# Patient Record
Sex: Male | Born: 1965 | Race: White | Hispanic: No | Marital: Married | Smoking: Former smoker
Health system: Southern US, Community
[De-identification: ages and names within clinical notes are randomized; demographics above are authoritative.]

## PROBLEM LIST (undated history)

## (undated) DIAGNOSIS — Z9889 Other specified postprocedural states: Secondary | ICD-10-CM

## (undated) DIAGNOSIS — F329 Major depressive disorder, single episode, unspecified: Secondary | ICD-10-CM

## (undated) DIAGNOSIS — R55 Syncope and collapse: Secondary | ICD-10-CM

## (undated) DIAGNOSIS — F32A Depression, unspecified: Secondary | ICD-10-CM

## (undated) DIAGNOSIS — Z950 Presence of cardiac pacemaker: Secondary | ICD-10-CM

## (undated) DIAGNOSIS — G8929 Other chronic pain: Secondary | ICD-10-CM

## (undated) DIAGNOSIS — Z87898 Personal history of other specified conditions: Secondary | ICD-10-CM

## (undated) HISTORY — PX: BACK SURGERY: SHX140

## (undated) HISTORY — DX: Presence of cardiac pacemaker: Z95.0

## (undated) HISTORY — DX: Personal history of other specified conditions: Z87.898

## (undated) HISTORY — PX: KNEE ARTHROSCOPY: SHX127

## (undated) HISTORY — DX: Other specified postprocedural states: Z98.890

## (undated) HISTORY — DX: Syncope and collapse: R55

## (undated) HISTORY — PX: SHOULDER SURGERY: SHX246

## (undated) HISTORY — PX: PACEMAKER INSERTION: SHX728

## (undated) HISTORY — PX: NECK SURGERY: SHX720

---

## 1998-03-31 ENCOUNTER — Inpatient Hospital Stay (HOSPITAL_COMMUNITY): Admission: RE | Admit: 1998-03-31 | Discharge: 1998-04-01 | Payer: Self-pay | Admitting: Neurosurgery

## 1998-04-21 ENCOUNTER — Emergency Department (HOSPITAL_COMMUNITY): Admission: EM | Admit: 1998-04-21 | Discharge: 1998-04-21 | Payer: Self-pay | Admitting: *Deleted

## 1998-10-28 ENCOUNTER — Encounter: Payer: Self-pay | Admitting: Specialist

## 1998-10-28 ENCOUNTER — Ambulatory Visit (HOSPITAL_COMMUNITY): Admission: RE | Admit: 1998-10-28 | Discharge: 1998-10-28 | Payer: Self-pay | Admitting: Specialist

## 1998-11-17 ENCOUNTER — Observation Stay (HOSPITAL_COMMUNITY): Admission: RE | Admit: 1998-11-17 | Discharge: 1998-11-18 | Payer: Self-pay | Admitting: Specialist

## 1998-11-17 ENCOUNTER — Encounter: Payer: Self-pay | Admitting: Specialist

## 1999-08-22 ENCOUNTER — Inpatient Hospital Stay (HOSPITAL_COMMUNITY): Admission: RE | Admit: 1999-08-22 | Discharge: 1999-08-25 | Payer: Self-pay | Admitting: Specialist

## 1999-08-22 ENCOUNTER — Encounter (INDEPENDENT_AMBULATORY_CARE_PROVIDER_SITE_OTHER): Payer: Self-pay | Admitting: Specialist

## 1999-08-22 ENCOUNTER — Encounter: Payer: Self-pay | Admitting: Specialist

## 2000-01-10 ENCOUNTER — Emergency Department (HOSPITAL_COMMUNITY): Admission: EM | Admit: 2000-01-10 | Discharge: 2000-01-10 | Payer: Self-pay

## 2000-08-07 ENCOUNTER — Encounter: Payer: Self-pay | Admitting: Neurosurgery

## 2000-08-07 ENCOUNTER — Encounter: Admission: RE | Admit: 2000-08-07 | Discharge: 2000-08-07 | Payer: Self-pay | Admitting: Neurosurgery

## 2001-04-16 ENCOUNTER — Encounter: Payer: Self-pay | Admitting: Neurosurgery

## 2001-04-16 ENCOUNTER — Ambulatory Visit (HOSPITAL_COMMUNITY): Admission: RE | Admit: 2001-04-16 | Discharge: 2001-04-16 | Payer: Self-pay | Admitting: Neurosurgery

## 2004-03-23 ENCOUNTER — Emergency Department (HOSPITAL_COMMUNITY): Admission: EM | Admit: 2004-03-23 | Discharge: 2004-03-23 | Payer: Self-pay | Admitting: Emergency Medicine

## 2004-04-24 ENCOUNTER — Encounter (HOSPITAL_COMMUNITY): Admission: RE | Admit: 2004-04-24 | Discharge: 2004-05-24 | Payer: Self-pay | Admitting: Internal Medicine

## 2004-07-05 ENCOUNTER — Inpatient Hospital Stay (HOSPITAL_COMMUNITY): Admission: EM | Admit: 2004-07-05 | Discharge: 2004-07-06 | Payer: Self-pay | Admitting: *Deleted

## 2004-07-21 ENCOUNTER — Ambulatory Visit: Payer: Self-pay | Admitting: *Deleted

## 2004-09-26 ENCOUNTER — Ambulatory Visit: Payer: Self-pay | Admitting: Psychiatry

## 2004-12-21 ENCOUNTER — Ambulatory Visit: Payer: Self-pay | Admitting: Psychiatry

## 2005-01-08 ENCOUNTER — Emergency Department (HOSPITAL_COMMUNITY): Admission: EM | Admit: 2005-01-08 | Discharge: 2005-01-08 | Payer: Self-pay | Admitting: Emergency Medicine

## 2005-02-13 ENCOUNTER — Ambulatory Visit: Payer: Self-pay | Admitting: Psychiatry

## 2005-04-07 ENCOUNTER — Ambulatory Visit: Payer: Self-pay | Admitting: Psychiatry

## 2005-04-07 ENCOUNTER — Emergency Department (HOSPITAL_COMMUNITY): Admission: EM | Admit: 2005-04-07 | Discharge: 2005-04-07 | Payer: Self-pay | Admitting: Emergency Medicine

## 2005-04-08 ENCOUNTER — Inpatient Hospital Stay (HOSPITAL_COMMUNITY): Admission: EM | Admit: 2005-04-08 | Discharge: 2005-04-12 | Payer: Self-pay | Admitting: Psychiatry

## 2005-05-29 ENCOUNTER — Ambulatory Visit: Payer: Self-pay | Admitting: Psychiatry

## 2005-08-21 ENCOUNTER — Ambulatory Visit: Payer: Self-pay | Admitting: Psychiatry

## 2005-10-21 ENCOUNTER — Emergency Department (HOSPITAL_COMMUNITY): Admission: EM | Admit: 2005-10-21 | Discharge: 2005-10-21 | Payer: Self-pay | Admitting: Emergency Medicine

## 2006-01-08 ENCOUNTER — Ambulatory Visit: Payer: Self-pay | Admitting: Psychiatry

## 2006-04-16 ENCOUNTER — Emergency Department (HOSPITAL_COMMUNITY): Admission: EM | Admit: 2006-04-16 | Discharge: 2006-04-17 | Payer: Self-pay | Admitting: Emergency Medicine

## 2006-05-28 ENCOUNTER — Emergency Department (HOSPITAL_COMMUNITY): Admission: EM | Admit: 2006-05-28 | Discharge: 2006-05-28 | Payer: Self-pay | Admitting: Emergency Medicine

## 2006-06-05 ENCOUNTER — Emergency Department (HOSPITAL_COMMUNITY): Admission: EM | Admit: 2006-06-05 | Discharge: 2006-06-05 | Payer: Self-pay | Admitting: Emergency Medicine

## 2007-02-22 ENCOUNTER — Emergency Department (HOSPITAL_COMMUNITY): Admission: EM | Admit: 2007-02-22 | Discharge: 2007-02-22 | Payer: Self-pay | Admitting: Emergency Medicine

## 2007-03-28 ENCOUNTER — Emergency Department: Payer: Self-pay | Admitting: Emergency Medicine

## 2007-04-03 ENCOUNTER — Emergency Department: Payer: Self-pay

## 2007-04-25 ENCOUNTER — Emergency Department (HOSPITAL_COMMUNITY): Admission: EM | Admit: 2007-04-25 | Discharge: 2007-04-25 | Payer: Self-pay | Admitting: Emergency Medicine

## 2007-05-06 ENCOUNTER — Emergency Department (HOSPITAL_COMMUNITY): Admission: EM | Admit: 2007-05-06 | Discharge: 2007-05-06 | Payer: Self-pay | Admitting: Emergency Medicine

## 2007-05-15 ENCOUNTER — Emergency Department (HOSPITAL_COMMUNITY): Admission: EM | Admit: 2007-05-15 | Discharge: 2007-05-15 | Payer: Self-pay | Admitting: Emergency Medicine

## 2007-05-21 ENCOUNTER — Emergency Department (HOSPITAL_COMMUNITY): Admission: EM | Admit: 2007-05-21 | Discharge: 2007-05-21 | Payer: Self-pay | Admitting: Emergency Medicine

## 2007-08-04 ENCOUNTER — Emergency Department (HOSPITAL_COMMUNITY): Admission: EM | Admit: 2007-08-04 | Discharge: 2007-08-04 | Payer: Self-pay | Admitting: Emergency Medicine

## 2007-08-25 ENCOUNTER — Emergency Department (HOSPITAL_COMMUNITY): Admission: EM | Admit: 2007-08-25 | Discharge: 2007-08-25 | Payer: Self-pay | Admitting: Emergency Medicine

## 2007-08-27 ENCOUNTER — Encounter: Payer: Self-pay | Admitting: Family Medicine

## 2007-08-27 ENCOUNTER — Inpatient Hospital Stay (HOSPITAL_COMMUNITY): Admission: EM | Admit: 2007-08-27 | Discharge: 2007-08-29 | Payer: Self-pay | Admitting: Emergency Medicine

## 2007-08-28 ENCOUNTER — Encounter
Admission: RE | Admit: 2007-08-28 | Discharge: 2007-09-11 | Payer: Self-pay | Admitting: Physical Medicine & Rehabilitation

## 2007-09-03 ENCOUNTER — Emergency Department (HOSPITAL_COMMUNITY): Admission: EM | Admit: 2007-09-03 | Discharge: 2007-09-03 | Payer: Self-pay | Admitting: Emergency Medicine

## 2007-09-05 ENCOUNTER — Ambulatory Visit (HOSPITAL_COMMUNITY): Admission: RE | Admit: 2007-09-05 | Discharge: 2007-09-05 | Payer: Self-pay | Admitting: Pain Medicine

## 2007-09-11 ENCOUNTER — Ambulatory Visit: Payer: Self-pay | Admitting: Physical Medicine & Rehabilitation

## 2007-09-26 ENCOUNTER — Emergency Department (HOSPITAL_COMMUNITY): Admission: EM | Admit: 2007-09-26 | Discharge: 2007-09-26 | Payer: Self-pay | Admitting: Emergency Medicine

## 2007-10-21 ENCOUNTER — Emergency Department (HOSPITAL_COMMUNITY): Admission: EM | Admit: 2007-10-21 | Discharge: 2007-10-21 | Payer: Self-pay | Admitting: Emergency Medicine

## 2007-11-19 ENCOUNTER — Emergency Department (HOSPITAL_COMMUNITY): Admission: EM | Admit: 2007-11-19 | Discharge: 2007-11-19 | Payer: Self-pay | Admitting: Emergency Medicine

## 2008-02-03 ENCOUNTER — Emergency Department (HOSPITAL_COMMUNITY): Admission: EM | Admit: 2008-02-03 | Discharge: 2008-02-03 | Payer: Self-pay | Admitting: Emergency Medicine

## 2008-05-19 ENCOUNTER — Emergency Department (HOSPITAL_COMMUNITY): Admission: EM | Admit: 2008-05-19 | Discharge: 2008-05-19 | Payer: Self-pay | Admitting: Emergency Medicine

## 2008-09-23 ENCOUNTER — Emergency Department (HOSPITAL_COMMUNITY): Admission: EM | Admit: 2008-09-23 | Discharge: 2008-09-23 | Payer: Self-pay | Admitting: Emergency Medicine

## 2008-09-28 ENCOUNTER — Emergency Department (HOSPITAL_COMMUNITY): Admission: EM | Admit: 2008-09-28 | Discharge: 2008-09-28 | Payer: Self-pay | Admitting: Emergency Medicine

## 2008-10-26 ENCOUNTER — Emergency Department (HOSPITAL_COMMUNITY): Admission: EM | Admit: 2008-10-26 | Discharge: 2008-10-27 | Payer: Self-pay | Admitting: Emergency Medicine

## 2008-10-26 ENCOUNTER — Encounter (INDEPENDENT_AMBULATORY_CARE_PROVIDER_SITE_OTHER): Payer: Self-pay | Admitting: *Deleted

## 2008-10-31 ENCOUNTER — Inpatient Hospital Stay (HOSPITAL_COMMUNITY): Admission: EM | Admit: 2008-10-31 | Discharge: 2008-11-03 | Payer: Self-pay | Admitting: Emergency Medicine

## 2008-10-31 ENCOUNTER — Ambulatory Visit: Payer: Self-pay | Admitting: Internal Medicine

## 2008-11-02 ENCOUNTER — Encounter: Payer: Self-pay | Admitting: Internal Medicine

## 2008-11-03 ENCOUNTER — Encounter (INDEPENDENT_AMBULATORY_CARE_PROVIDER_SITE_OTHER): Payer: Self-pay | Admitting: *Deleted

## 2009-05-08 ENCOUNTER — Emergency Department (HOSPITAL_COMMUNITY): Admission: EM | Admit: 2009-05-08 | Discharge: 2009-05-08 | Payer: Self-pay | Admitting: Emergency Medicine

## 2009-08-21 ENCOUNTER — Inpatient Hospital Stay (HOSPITAL_COMMUNITY): Admission: AD | Admit: 2009-08-21 | Discharge: 2009-08-24 | Payer: Self-pay | Admitting: Psychiatry

## 2009-08-21 ENCOUNTER — Ambulatory Visit: Payer: Self-pay | Admitting: Psychiatry

## 2009-08-21 ENCOUNTER — Other Ambulatory Visit: Payer: Self-pay | Admitting: Emergency Medicine

## 2009-08-31 ENCOUNTER — Emergency Department (HOSPITAL_COMMUNITY): Admission: EM | Admit: 2009-08-31 | Discharge: 2009-08-31 | Payer: Self-pay | Admitting: Emergency Medicine

## 2009-12-18 ENCOUNTER — Emergency Department (HOSPITAL_COMMUNITY): Admission: EM | Admit: 2009-12-18 | Discharge: 2009-12-19 | Payer: Self-pay | Admitting: Emergency Medicine

## 2009-12-28 ENCOUNTER — Emergency Department (HOSPITAL_COMMUNITY): Admission: EM | Admit: 2009-12-28 | Discharge: 2009-12-28 | Payer: Self-pay | Admitting: Emergency Medicine

## 2010-01-19 ENCOUNTER — Emergency Department (HOSPITAL_COMMUNITY): Admission: EM | Admit: 2010-01-19 | Discharge: 2010-01-19 | Payer: Self-pay | Admitting: Emergency Medicine

## 2010-03-26 ENCOUNTER — Emergency Department (HOSPITAL_COMMUNITY): Admission: EM | Admit: 2010-03-26 | Discharge: 2010-03-26 | Payer: Self-pay | Admitting: Emergency Medicine

## 2010-10-23 ENCOUNTER — Emergency Department (HOSPITAL_COMMUNITY)
Admission: EM | Admit: 2010-10-23 | Discharge: 2010-10-23 | Payer: Self-pay | Source: Home / Self Care | Admitting: Emergency Medicine

## 2010-10-29 ENCOUNTER — Inpatient Hospital Stay (HOSPITAL_COMMUNITY): Admission: EM | Admit: 2010-10-29 | Discharge: 2010-10-31 | Payer: Self-pay | Source: Home / Self Care

## 2010-10-30 ENCOUNTER — Inpatient Hospital Stay (HOSPITAL_COMMUNITY)
Admission: AD | Admit: 2010-10-30 | Discharge: 2010-10-31 | Payer: Self-pay | Attending: Cardiovascular Disease | Admitting: Cardiovascular Disease

## 2011-01-22 LAB — BRAIN NATRIURETIC PEPTIDE: Pro B Natriuretic peptide (BNP): 66 pg/mL (ref 0.0–100.0)

## 2011-01-22 LAB — CBC
HCT: 46 % (ref 39.0–52.0)
Hemoglobin: 15.7 g/dL (ref 13.0–17.0)
MCH: 30.8 pg (ref 26.0–34.0)
MCHC: 34 g/dL (ref 30.0–36.0)
MCV: 90.4 fL (ref 78.0–100.0)
Platelets: 315 10*3/uL (ref 150–400)
Platelets: 293 10*3/uL (ref 150–400)
RBC: 5.09 MIL/uL (ref 4.22–5.81)
RDW: 13.9 % (ref 11.5–15.5)
WBC: 8.4 10*3/uL (ref 4.0–10.5)

## 2011-01-22 LAB — URINALYSIS, ROUTINE W REFLEX MICROSCOPIC
Glucose, UA: NEGATIVE mg/dL
Ketones, ur: NEGATIVE mg/dL
Nitrite: NEGATIVE
pH: 6.5 (ref 5.0–8.0)

## 2011-01-22 LAB — DIFFERENTIAL
Eosinophils Absolute: 0.2 10*3/uL (ref 0.0–0.7)
Eosinophils Absolute: 0.1 10*3/uL (ref 0.0–0.7)
Eosinophils Relative: 3 % (ref 0–5)
Eosinophils Relative: 1 % (ref 0–5)
Lymphocytes Relative: 26 % (ref 12–46)
Lymphs Abs: 2.4 10*3/uL (ref 0.7–4.0)
Lymphs Abs: 2.2 10*3/uL (ref 0.7–4.0)
Monocytes Absolute: 0.7 10*3/uL (ref 0.1–1.0)
Monocytes Relative: 10 % (ref 3–12)
Monocytes Relative: 8 % (ref 3–12)

## 2011-01-22 LAB — POCT CARDIAC MARKERS
Myoglobin, poc: 61.5 ng/mL (ref 12–200)
Troponin i, poc: <0.05 ng/mL (ref 0.00–0.09)
Troponin i, poc: <0.05 ng/mL (ref 0.00–0.09)

## 2011-01-22 LAB — COMPREHENSIVE METABOLIC PANEL
ALT: 27 U/L (ref 0–53)
AST: 36 U/L (ref 0–37)
Calcium: 9.4 mg/dL (ref 8.4–10.5)
GFR calc Af Amer: >60 mL/min (ref >60)
Sodium: 144 mEq/L (ref 135–145)
Total Protein: 6.8 g/dL (ref 6.0–8.3)

## 2011-01-22 LAB — CARDIAC PANEL(CRET KIN+CKTOT+MB+TROPI)
Relative Index: INVALID (ref 0.0–2.5)
Troponin I: <0.01 ng/mL (ref 0.00–0.06)

## 2011-01-22 LAB — BASIC METABOLIC PANEL
CO2: 27 mEq/L (ref 19–32)
Chloride: 106 mEq/L (ref 96–112)
Creatinine, Ser: 0.76 mg/dL (ref 0.4–1.5)
GFR calc Af Amer: >60 mL/min (ref >60)
Potassium: 4.2 mEq/L (ref 3.5–5.1)

## 2011-01-22 LAB — CK TOTAL AND CKMB (NOT AT ARMC)
CK, MB: 0.8 ng/mL (ref 0.3–4.0)
Total CK: 100 U/L (ref 7–232)

## 2011-01-22 LAB — PROTIME-INR: Prothrombin Time: 13.1 seconds (ref 11.6–15.2)

## 2011-01-22 LAB — LIPID PANEL
HDL: 34 mg/dL — ABNORMAL LOW (ref >39)
VLDL: 29 mg/dL (ref 0–40)

## 2011-01-22 LAB — TROPONIN I: Troponin I: <0.01 ng/mL (ref 0.00–0.06)

## 2011-01-31 LAB — DIFFERENTIAL
Eosinophils Absolute: 0.1 10*3/uL (ref 0.0–0.7)
Eosinophils Relative: 1 % (ref 0–5)
Lymphocytes Relative: 24 % (ref 12–46)
Lymphs Abs: 2 10*3/uL (ref 0.7–4.0)
Monocytes Absolute: 0.4 10*3/uL (ref 0.1–1.0)
Monocytes Relative: 5 % (ref 3–12)

## 2011-01-31 LAB — RAPID URINE DRUG SCREEN, HOSP PERFORMED
Cocaine: NOT DETECTED
Opiates: POSITIVE — AB
Tetrahydrocannabinol: POSITIVE — AB

## 2011-01-31 LAB — CBC
HCT: 43.7 % (ref 39.0–52.0)
Hemoglobin: 14.8 g/dL (ref 13.0–17.0)
MCV: 94.9 fL (ref 78.0–100.0)
WBC: 8.4 10*3/uL (ref 4.0–10.5)

## 2011-01-31 LAB — URINALYSIS, ROUTINE W REFLEX MICROSCOPIC
Bilirubin Urine: NEGATIVE
Ketones, ur: NEGATIVE mg/dL
Nitrite: NEGATIVE
Specific Gravity, Urine: 1.01 (ref 1.005–1.030)
Urobilinogen, UA: 1 mg/dL (ref 0.0–1.0)
pH: 6.5 (ref 5.0–8.0)

## 2011-01-31 LAB — BASIC METABOLIC PANEL
Chloride: 109 mEq/L (ref 96–112)
GFR calc non Af Amer: >60 mL/min (ref >60)
Potassium: 3.4 mEq/L — ABNORMAL LOW (ref 3.5–5.1)
Sodium: 143 mEq/L (ref 135–145)

## 2011-01-31 LAB — ETHANOL: Alcohol, Ethyl (B): <5 mg/dL (ref 0–10)

## 2011-02-04 LAB — DIFFERENTIAL
Basophils Absolute: 0.1 10*3/uL (ref 0.0–0.1)
Basophils Relative: 1 % (ref 0–1)
Lymphocytes Relative: 37 % (ref 12–46)
Monocytes Absolute: 0.6 10*3/uL (ref 0.1–1.0)
Neutro Abs: 3.8 10*3/uL (ref 1.7–7.7)
Neutrophils Relative %: 51 % (ref 43–77)

## 2011-02-04 LAB — URINALYSIS, ROUTINE W REFLEX MICROSCOPIC
Bilirubin Urine: NEGATIVE
Nitrite: NEGATIVE
Specific Gravity, Urine: <1.005 — ABNORMAL LOW (ref 1.005–1.030)
Urobilinogen, UA: 0.2 mg/dL (ref 0.0–1.0)
pH: 6.5 (ref 5.0–8.0)

## 2011-02-04 LAB — RAPID URINE DRUG SCREEN, HOSP PERFORMED
Cocaine: NOT DETECTED
Opiates: POSITIVE — AB
Tetrahydrocannabinol: POSITIVE — AB

## 2011-02-04 LAB — CBC
HCT: 40.9 % (ref 39.0–52.0)
Hemoglobin: 14.1 g/dL (ref 13.0–17.0)
MCHC: 34.6 g/dL (ref 30.0–36.0)
MCV: 93.5 fL (ref 78.0–100.0)
RDW: 13.3 % (ref 11.5–15.5)

## 2011-02-04 LAB — COMPREHENSIVE METABOLIC PANEL
Alkaline Phosphatase: 70 U/L (ref 39–117)
BUN: 2 mg/dL — ABNORMAL LOW (ref 6–23)
CO2: 26 mEq/L (ref 19–32)
Chloride: 108 mEq/L (ref 96–112)
Creatinine, Ser: 0.7 mg/dL (ref 0.4–1.5)
GFR calc non Af Amer: >60 mL/min (ref >60)
Glucose, Bld: 83 mg/dL (ref 70–99)
Potassium: 3.7 mEq/L (ref 3.5–5.1)
Total Bilirubin: 0.2 mg/dL — ABNORMAL LOW (ref 0.3–1.2)

## 2011-02-04 LAB — POCT CARDIAC MARKERS
CKMB, poc: <1 ng/mL — ABNORMAL LOW (ref 1.0–8.0)
Troponin i, poc: <0.05 ng/mL (ref 0.00–0.09)

## 2011-02-15 LAB — ETHANOL: Alcohol, Ethyl (B): <5 mg/dL (ref 0–10)

## 2011-02-15 LAB — URINALYSIS, ROUTINE W REFLEX MICROSCOPIC
Bilirubin Urine: NEGATIVE
Ketones, ur: NEGATIVE mg/dL
Nitrite: NEGATIVE
Specific Gravity, Urine: <1.005 — ABNORMAL LOW (ref 1.005–1.030)
Urobilinogen, UA: 0.2 mg/dL (ref 0.0–1.0)

## 2011-02-15 LAB — DIFFERENTIAL
Basophils Absolute: 0 10*3/uL (ref 0.0–0.1)
Basophils Relative: 1 % (ref 0–1)
Eosinophils Absolute: 0.1 10*3/uL (ref 0.0–0.7)
Monocytes Relative: 8 % (ref 3–12)
Neutrophils Relative %: 64 % (ref 43–77)

## 2011-02-15 LAB — COMPREHENSIVE METABOLIC PANEL
ALT: 46 U/L (ref 0–53)
Alkaline Phosphatase: 104 U/L (ref 39–117)
CO2: 31 mEq/L (ref 19–32)
Chloride: 102 mEq/L (ref 96–112)
GFR calc non Af Amer: >60 mL/min (ref >60)
Glucose, Bld: 103 mg/dL — ABNORMAL HIGH (ref 70–99)
Potassium: 4 mEq/L (ref 3.5–5.1)
Sodium: 140 mEq/L (ref 135–145)
Total Bilirubin: 0.6 mg/dL (ref 0.3–1.2)
Total Protein: 7.5 g/dL (ref 6.0–8.3)

## 2011-02-15 LAB — GLUCOSE, CAPILLARY

## 2011-02-15 LAB — CBC
HCT: 47.5 % (ref 39.0–52.0)
Hemoglobin: 16 g/dL (ref 13.0–17.0)
RBC: 4.98 MIL/uL (ref 4.22–5.81)
WBC: 7.8 10*3/uL (ref 4.0–10.5)

## 2011-02-15 LAB — RAPID URINE DRUG SCREEN, HOSP PERFORMED: Tetrahydrocannabinol: POSITIVE — AB

## 2011-02-20 ENCOUNTER — Emergency Department (HOSPITAL_COMMUNITY)
Admission: EM | Admit: 2011-02-20 | Discharge: 2011-02-20 | Disposition: A | Discharge status: Home / Self Care | Payer: Medicare PPO | Attending: Emergency Medicine | Admitting: Emergency Medicine

## 2011-02-20 ENCOUNTER — Emergency Department (HOSPITAL_COMMUNITY): Payer: Medicare PPO

## 2011-02-20 DIAGNOSIS — Z79899 Other long term (current) drug therapy: Secondary | ICD-10-CM | POA: Insufficient documentation

## 2011-02-20 DIAGNOSIS — Z950 Presence of cardiac pacemaker: Secondary | ICD-10-CM | POA: Insufficient documentation

## 2011-02-20 DIAGNOSIS — M129 Arthropathy, unspecified: Secondary | ICD-10-CM | POA: Insufficient documentation

## 2011-02-20 DIAGNOSIS — G8929 Other chronic pain: Secondary | ICD-10-CM | POA: Insufficient documentation

## 2011-02-20 DIAGNOSIS — R569 Unspecified convulsions: Secondary | ICD-10-CM | POA: Insufficient documentation

## 2011-02-20 LAB — RAPID URINE DRUG SCREEN, HOSP PERFORMED
Barbiturates: NOT DETECTED
Benzodiazepines: NOT DETECTED
Cocaine: NOT DETECTED

## 2011-02-20 LAB — CBC
HCT: 44.2 % (ref 39.0–52.0)
MCHC: 33.3 g/dL (ref 30.0–36.0)
MCV: 92.7 fL (ref 78.0–100.0)
RDW: 13.7 % (ref 11.5–15.5)

## 2011-02-20 LAB — COMPREHENSIVE METABOLIC PANEL
BUN: 10 mg/dL (ref 6–23)
Calcium: 9.6 mg/dL (ref 8.4–10.5)
Glucose, Bld: 88 mg/dL (ref 70–99)
Sodium: 138 mEq/L (ref 135–145)
Total Protein: 7.7 g/dL (ref 6.0–8.3)

## 2011-02-20 LAB — DIFFERENTIAL
Basophils Absolute: 0 10*3/uL (ref 0.0–0.1)
Eosinophils Absolute: 0.1 10*3/uL (ref 0.0–0.7)
Eosinophils Relative: 2 % (ref 0–5)
Lymphocytes Relative: 36 % (ref 12–46)
Monocytes Absolute: 0.4 10*3/uL (ref 0.1–1.0)

## 2011-02-20 LAB — ETHANOL: Alcohol, Ethyl (B): <5 mg/dL (ref 0–10)

## 2011-03-27 NOTE — Discharge Summary (Signed)
NAMELATHEN, SEAL NO.:  [id number]   MEDICAL RECORD NO.:  [id number]          PATIENT TYPE:  INP   LOCATION:  2007                         FACILITY:  [facility]   PHYSICIAN:  Ileana Roup, M.D.  DATE OF BIRTH:  10-09-1966   DATE OF ADMISSION:  10/31/2008  DATE OF DISCHARGE:  11/03/2008                               DISCHARGE SUMMARY   DISCHARGE DIAGNOSES:  1. Chest pain, likely gastrointestinal in etiology with ferritin of      23.  2. Sinus bradycardia with junctional rhythm.  3. Hyperlipidemia, HDL 33 and LDL 162.  4. Tobacco abuse.  5. Hypokalemia, resolved.  6. Iron-deficiency anemia.   DISCHARGE MEDICATIONS:  1. Cymbalta 60 mg p.o. b.i.d.  2. Lyrica 150 mg p.o. b.i.d.  3. Zanaflex 4 mg p.o. t.i.d.  4. Ferrous sulfate 325 mg p.o. t.i.d.  5. Zofran 4 mg p.o. q.8 h p.r.n. for nausea and vomiting.  6. Protonix 40 mg p.o. daily.  7. Crestor 20 mg p.o. daily.  8. Nitroglycerin 0.4 mg sublingually as needed for chest pain up to 3      times every 5 minutes.  9. Percocet 5/500 one pill p.o. q.6 h p.r.n. for pain.   CONSULTATIONS:  Dr. Nanetta Jensen from [facility].   PROCEDURES DONE DURING THIS HOSPITALIZATION:  Chest x-ray done on  October 31, 2008, is negative for any acute thoracic findings.  CT  angio of the chest done on November 02, 2008, is negative for any acute  cardiopulmonary process.  Cardiac catheterization done on November 01, 2008, showed an EF of 60% without any focal wall motion abnormalities;  left main coronary artery is normal, LAD is normal, left circumflex is  normal, and right coronary artery is dominant and normal.   HISTORY OF PRESENT ILLNESS:  Isaac Jensen is a 45 year old gentleman with  history of bradycardia, ongoing tobacco abuse, history of polysubstance  abuse, and depression was brought by EMS to [facility] ED with chest pain.  On  the day of admission, the patient was walking about 15 feet from  bathroom  to bed and had severe substernal chest pain.  It was sharp,  10/10, and radiating to left arm.  He had shortness of breath and  diaphoresis and nausea, but no vomiting.  The patient's son then gave  couple of aspirin and called the EMS.  His pain lasted for about 20  minutes and resolved after EMS gave 2 nitro sprays.  The pain is  aggravated by deep breath, but no relieving factor.  At the time of  admission, it was 2/10.  The patient was having some chest pain few  weeks back both at exertion and at rest, but  burping relieved the pain.  No fever, but the patient had chills.  No cough, hemoptysis, recent  travel, or surgery and no leg swelling.   PHYSICAL EXAMINATION:  VITAL SIGNS:  Temperature 98.1, blood pressure  149/80, pulse 31, respirations 14, and oxygen saturation 100% on 2  liters.  GENERAL:  Not in acute distress.  HEENT:  Eyes; PERRLA.  Extraocular muscles movement  intact.  Anicteric.  Oropharynx pink and moist.  Tongue coated.  NECK:  Supple.  JVD about 8 cm.  No bruit or mass.  CHEST:  Bilaterally clear to auscultation.  No crackles or wheeze.  CARDIOVASCULAR:  First and second heart sounds normal.  The patient is  bradycardic.  No rubs, murmurs, or gallops.  Pedal pulses bilaterally  strong.  GI:  Bowel sounds normal.  Soft and nontender.  No organomegaly.  EXTREMITIES:  No pedal pitting edema.  No calf tenderness.  NEUROLOGIC:  Alert and oriented x4 and nonfocal.  PSYCHIATRIC:  Appropriate.   LABORATORY DATA:  On admission, WBC 5.8, ANC 3, hemoglobin 11.6, and MCV  93.  Sodium 141, potassium 3.2, chloride 106, bicarb 25, BUN 4,  creatinine 0.73, and glucose 104.  Chest x-ray, no acute thoracic  finding.  UDS positive for THC.  Point-of-care cardiac markers negative.  Calcium 8.7.  A 12-lead EKG, sinus bradycardia with a heart rate of 58  with short PR interval with some junctional beats without any ST-T wave  changes.   HOSPITAL COURSE:  1. Chest pain.  The  patient was admitted to telemetry bed.  The      patient was started on aspirin and heparin, and beta-blocker was      not started because of the marked bradycardia at presentation.      Cardiac enzymes were cycled and were negative x3.  The patient had      a cardiac catheterization by Dr. Nanetta Jensen and had normal      coronary arteries and normal left ventricular function.  His      hemoglobin A1c was 6 and a lipid panel showed HDL of 33 and LDL of      621 and the patient was started on Crestor.  We also checked a D-      dimer which was elevated at 0.61 and since the patient's pain was      pleuritic in nature as well, we got a CT angio of chest which was      negative for any acute cardiopulmonary process.  The patient says      that he had pain before and the pain was better with burping.  His      presenting hemoglobin was 11.6 and an anemia panel was obtained      which was positive for percent saturation of 11, total ferritin of      23, vitamin B12 of 267 and RBC folate of 499.  We monitored the      patient in the hospital.  The patient did not have any chest pain,      shortness of breath, abdominal pain, bloody diarrhea, blood in      stool, black stool, or hematemesis.  We felt comfortable enough to      send him home and follow him up with Isaac Jensen in [facility] on January 15, at 11:00 a.m..  The patient is sent      home on Protonix, ferrous sulfate, and Crestor along with some      p.r.n. nitroglycerin.  The patient can also call [facility] if there is any more heart problems according      to the Cardiology recommendation.  2. Sinus bradycardia with junctional rhythm.  It looks like this is a      chronic problem and when the patient presented to the ED,  his heart      rate was in low 30s, but overall after admission, his heart was      much better in between upper 40 to 80, but basically in upper 50s      most of  the time.  The EKG was positive for sinus bradycardia with      junctional skips.  Cardiology recommended observation and follow      up.  The patient is instructed to call Cardiology if his heart rate      is persistently low and has any symptoms.  We checked TSH which was      normal at 0.866.  3. Tobacco abuse.  The patient has been counseled for smoking      cessation.  4. Hyperlipidemia.  The patient was started on Crestor and sent on      with the same.  5. Hypokalemia, which was present at admission, which has been      repleted and resolved.  Magnesium was checked which was normal at      2.1.   DISCHARGE VITAL SIGNS:  Temperature 97, pulse 54, respirations 18, blood  pressure 122/82, and oxygen saturation 95 on room air.   DISCHARGE LABORATORY DATA:  Sodium 143, potassium 4.3, chloride 107,  bicarb 28, glucose 102, BUN 4, creatinine 0.8, and calcium 9.2.  WBC  4.8, RBC 4.19, hemoglobin 13, MCV 92.8, platelet 337, and RDW 14.4.   CONDITION ON DISCHARGE:  The patient is not having any chest pain or  shortness of breath or any bloody stool or black stool.   DISPOSITION AND FOLLOWUP:  Mr. Tanney has an appointment with his  primary care doctor in [place], [place], Dr. Catalina Jensen, on  coming Monday, which is couple of days from the day of discharge.  He  also has appointment with Isaac Jensen of [facility] on January 15, and  as needed appointment with [facility].   FOLLOWUP ISSUES FOR PRIMARY CARE PHYSICIAN:  1. Any persistence of the symptoms for admission.  2. Follow up on the heart rate and if it is low as well as      symptomatic, the patient may benefit by Cardiology referral.  The      patient has been seen by [facility] in this      hospitalization and they have welcomed the patient to be seen in      the office as needed.  3. Hyperlipidemia.  The patient has been started on Crestor and his      lipid panel has to be checked  in about 3 months from now and      medicine adjustment as necessary.  4. Smoking/tobacco abuse.  The patient has been counseled regarding      smoking cessation, this needs to be followed up.  5. Iron-deficiency anemia.  The patient's iron panel should be checked      up later and he is started on ferrous sulfate.  The patient's      tolerance to this medications should also be assessed.  GI referral      was made as noted above.   FOLLOW UP ISSUE FOR Isaac Jensen:  The patient had negative workup for  coronary artery disease and PE and had some GI symptoms and low  ferritin, so he would probably benefit from getting assessment for GI  blood loss.      Isaac Coop, MD  Electronically Signed  Ileana Roup, M.D.  Electronically Signed    YP/MEDQ  D:  11/03/2008  T:  11/04/2008  Job:  [id number]   cc:   Isaac Antigua, MD  Isaac Boop, MD,FACG  Isaac Jensen, M.D.  Isaac Jensen, M.D.
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
## 2011-03-27 NOTE — Discharge Summary (Signed)
Isaac Jensen, Isaac Jensen                ACCOUNT NO.:  [id number]   MEDICAL RECORD NO.:  [id number]          PATIENT TYPE:  REC   LOCATION:  TPC                          FACILITY:  [facility]   PHYSICIAN:  Madelynn Done, MD  DATE OF BIRTH:  [birth year]   DATE OF ADMISSION:  08/28/2007  DATE OF DISCHARGE:  08/29/2007                               DISCHARGE SUMMARY   ADMISSION DIAGNOSES:  1. Cat bite right hand.  2. Cellulitis right hand.  3. Tobacco use.   PROCEDURES AND DATE:  None.   REASON FOR ADMISSION:  Cat bite with worsening cellulitis, admission for  IV antibiotics and immobilization.   DISCHARGE MEDICATIONS:  1. Augmentin 875 mg p.o. twice a day.  2. Percocet 7.5 + 500 one to two tablets q.4-6 h. as needed for pain.   BRIEF HISTORY:  Mr. Patras is a 45 year old gentleman who is on  disability for his lumbar and cervical spine problems who presented  after sustaining a cat bite, from a stray cat, to the hospital on  August 27, 2007.  He was transferred down to [facility] for  further care and treatment.  Upon evaluation in the emergency department  he was admitted for IV antibiotics and immobilization for the cellulitis  on the dorsum of his right hand.   HOSPITAL COURSE:  The patient was admitted on IV Unasyn.  His hand was  mobilized.  He was followed throughout his hospital course and appeared  to be responding well to the IV therapy.  He was seen on August 29, 2007.  He was afebrile.  His vital signs were stable and normal.  He had  less redness, no worsening signs of infection, and he was felt ready to  be discharged to home on oral antibiotics.   RECOMMENDATIONS AND DISPOSITION:  He was to be discharged to home.  He  was given contact information in case he has any worsening pain or  swelling, to contact the office.  I will plan to see him back in the  office on September 01, 2007 for a wound check.  He is to keep the splint  on at all times.  He is supposed  to keep his hand elevated, and continue  with the above discharge medications.  Prior to his discharge all  questions for addressed.  The patient voiced understanding of the plan.      Madelynn Done, MD  Electronically Signed     FWO/MEDQ  D:  08/30/2007  T:  09/01/2007  Job:  [id number]

## 2011-03-27 NOTE — Cardiovascular Report (Signed)
NAME:  JEWELZ, KOBUS NO.:  [id number]   MEDICAL RECORD NO.:  [id number]          PATIENT TYPE:  INP   LOCATION:  2007                         FACILITY:  [facility]   PHYSICIAN:  Nanetta Batty, M.D.   DATE OF BIRTH:  Mar 09, 1966   DATE OF PROCEDURE:  DATE OF DISCHARGE:                            CARDIAC CATHETERIZATION   Mr. Sartin is a 45 year old Caucasian male consulted on by Dr. Sheliah Mends on October 31, 2008, for chest pain.  He has a history of  tobacco abuse and premature family history of heart disease as well as  hyperlipidemia.  He ruled out for myocardial infarction and was placed  on IV heparin and nitroglycerin.  He presents now for diagnostic  coronary arteriography to define his anatomy and rule out ischemic  etiology.   PROCEDURE DESCRIPTION:  The patient was brought to the [facility] in the postabsorptive state.  He was  premedicated with p.o. Valium.  His right groin was prepped and shaved  in the usual sterile fashion.  1% Xylocaine was used for local  anesthesia.  A 6-French sheath was inserted into the right femoral  artery using standard Seldinger technique.  A 6-French left Judkins  diagnostic catheter as well as 6-French pigtail catheter were used for  selective coronary angiography, left ventriculography respectively.  Visipaque dye was used for the entirety of the case.  Retrograde aortic,  left ventricular, and pullback pressures were recorded.   Dr. Garen Lah was scrubbed and present during the case.   HEMODYNAMICS:  1. Aortic systolic pressure 151, diastolic pressure 78.  2. Left ventricular systolic pressure 157, end-diastolic pressure 10.   SELECTIVE CORONARY ANGIOGRAPHY:  1. Left main normal.  2. LAD normal.  3. Left circumflex normal.  4. Right coronary artery is dominant normal.  5. Ventriculography; RAO left ventriculogram was performed using 25 mL      of Visipaque dye 12 mL per second.   The overall LVEF was estimated      greater than 60% without focal wall motion abnormalities.   IMPRESSION:  Mr. Huttner has essentially normal coronary arteries with  normal left ventricular function.  There is no cardiac etiology to his  chest pain.  Other etiologies including gastrointestinal will be  pursued.  Empiric antireflux therapy will be recommended.   Sheath was removed and pressure was held in the groin to achieve  hemostasis.  The patient left the lab in stable condition.      Nanetta Batty, M.D.  Electronically Signed     JB/MEDQ  D:  11/01/2008  T:  11/02/2008  Job:  [id number]   cc:   [facility]  [facility]

## 2011-03-30 NOTE — Procedures (Signed)
NAME:  Isaac Jensen, Isaac Jensen                          ACCOUNT NO.:  [id number]   MEDICAL RECORD NO.:  [id number]                   PATIENT TYPE:  INP   LOCATION:  IC09                                 FACILITY:  [facility]   PHYSICIAN:  Edward L. Juanetta Gosling, M.D.             DATE OF BIRTH:  April 12, 1966   DATE OF PROCEDURE:  07/05/2004  DATE OF DISCHARGE:                                EKG INTERPRETATION   EKG INTERPRETATION:  The rhythm appears to be a fairly extreme bradycardia.  In part of the tracing, there is significant artifact but there is also what  looks like maybe a P wave without an associated QRS complex and I wonder if  this is a more complex arrhythmia than simply a bradycardia. QT interval is  prolonged and the computer says inferior ST depression area in the inferior  leads and as there are a lot of the artifacts, I cannot make that diagnosis  based on this electrocardiogram.   IMPRESSION:  Abnormal electrocardiogram.      ___________________________________________                                            Oneal Deputy. Juanetta Gosling, M.D.   ELH/MEDQ  D:  07/05/2004  T:  07/05/2004  Job:  [id number]

## 2011-03-30 NOTE — H&P (Signed)
NAME:  Isaac Jensen, Isaac Jensen                          ACCOUNT NO.:  [id number]   MEDICAL RECORD NO.:  [id number]                   PATIENT TYPE:  INP   LOCATION:  IC09                                 FACILITY:  [facility]   PHYSICIAN:  Madelin Rear. Sherwood Gambler, M.D.             DATE OF BIRTH:  29-Mar-1966   DATE OF ADMISSION:  07/05/2004  DATE OF DISCHARGE:                                HISTORY & PHYSICAL   CHIEF COMPLAINT:  Poly-drug overdose.   HISTORY OF PRESENT ILLNESS:  The patient became depressed over recurrent  chronic pain syndrome managed as an outpatient, and approximately 0930 on  July 04, 2004, ingested progressively increasing amounts of Tylenol  containing medications including over-the-counter Tylenol.  He denied any  alcohol ingestion.  Approximately 45 minutes to an hour later, the patient  developed emesis suggesting true Tylenol toxicity.  He persisted through the  day to continue to ingest what he could for medications, and subsequently  was convinced to go to the emergency department because he felt ill.  He  presented there some time around 2300 hours.   PAST MEDICAL HISTORY:  1. Depression.  2. Unrelenting back pain.  3. Osteoarthritis.   SOCIAL HISTORY:  Married and lives with his wife.   FAMILY HISTORY:  Not contributory.   REVIEW OF SYSTEMS:  Positive for ingestion of Risperdal and positive urine  drug screen for cannabis as well as cocaine.   PHYSICAL EXAMINATION:  GENERAL:  He is awake, alert, no evidence of  respiratory depression.  HEENT:  Head and neck, no JVD or adenopathy.  Neck is supple.  CHEST:  Clear.  CARDIAC:  Regular rhythm with no murmur, gallop or rub, although bradycardic  at 50.  ABDOMEN:  Soft, no organomegaly or masses.  EXTREMITIES:  No clubbing, cyanosis or edema.  NEUROLOGIC:  Examination is nonfocal.   LABORATORY DATA:  Arterial blood gas at 0029 hours reveals normal capnia and  no hypoxia.  He is not evidencing any respiratory  depression.   CBC was unremarkable.  Electrolytes, mild hypokalemia at 3.4, but otherwise  unrevealing.  Liver function tests are specifically normal.   Urine drug screen was positive for benzodiazepines, cocaine, opiates,  tetrahydrocannabinus, and his alcohol level was unremarkable.  Acetaminophen  level is 53.9.  When plots sets on the nomogram, it is toxic as a 12-hour  level.  He was given a loading dose of Mucomyst in the emergency department.   IMPRESSION:  1. Polysubstance overdose, specifically with high-risk Tylenol liver     toxicity.  The patient will get a 17-dose regimen of Mucomyst and serial     liver function tests.  2. He will be monitored with element and suicidal precautions, as well as be     seen by Behavioral Health to treat his depression and drug abuse problem.     ___________________________________________  Madelin Rear. Sherwood Gambler, M.D.   LJF/MEDQ  D:  07/05/2004  T:  07/05/2004  Job:  [id number]

## 2011-04-07 ENCOUNTER — Emergency Department (HOSPITAL_COMMUNITY)
Admission: EM | Admit: 2011-04-07 | Discharge: 2011-04-07 | Disposition: A | Discharge status: Home / Self Care | Payer: Medicare PPO | Attending: Emergency Medicine | Admitting: Emergency Medicine

## 2011-04-07 ENCOUNTER — Emergency Department (HOSPITAL_COMMUNITY): Payer: Medicare PPO

## 2011-04-07 DIAGNOSIS — R071 Chest pain on breathing: Secondary | ICD-10-CM | POA: Insufficient documentation

## 2011-04-07 DIAGNOSIS — Z950 Presence of cardiac pacemaker: Secondary | ICD-10-CM | POA: Insufficient documentation

## 2011-04-07 DIAGNOSIS — G40802 Other epilepsy, not intractable, without status epilepticus: Secondary | ICD-10-CM | POA: Insufficient documentation

## 2011-04-07 DIAGNOSIS — Z79899 Other long term (current) drug therapy: Secondary | ICD-10-CM | POA: Insufficient documentation

## 2011-04-07 DIAGNOSIS — M129 Arthropathy, unspecified: Secondary | ICD-10-CM | POA: Insufficient documentation

## 2011-04-07 LAB — LIPASE, BLOOD: Lipase: 59 U/L (ref 11–59)

## 2011-04-07 LAB — BASIC METABOLIC PANEL WITH GFR
BUN: 7 mg/dL (ref 6–23)
CO2: 25 meq/L (ref 19–32)
Calcium: 10.3 mg/dL (ref 8.4–10.5)
Chloride: 97 meq/L (ref 96–112)
Creatinine, Ser: 0.8 mg/dL (ref 0.4–1.5)
GFR calc non Af Amer: >60 mL/min
Glucose, Bld: 103 mg/dL — ABNORMAL HIGH (ref 70–99)
Potassium: 3 meq/L — ABNORMAL LOW (ref 3.5–5.1)
Sodium: 134 meq/L — ABNORMAL LOW (ref 135–145)

## 2011-04-07 LAB — CBC
HCT: 42.7 % (ref 39.0–52.0)
Hemoglobin: 14.6 g/dL (ref 13.0–17.0)
MCH: 30.7 pg (ref 26.0–34.0)
MCHC: 34.2 g/dL (ref 30.0–36.0)
MCV: 89.7 fL (ref 78.0–100.0)
Platelets: 374 10*3/uL (ref 150–400)
RBC: 4.76 MIL/uL (ref 4.22–5.81)
RDW: 12.8 % (ref 11.5–15.5)
WBC: 10.1 10*3/uL (ref 4.0–10.5)

## 2011-04-07 LAB — DIFFERENTIAL
Basophils Absolute: 0 10*3/uL (ref 0.0–0.1)
Basophils Relative: 0 % (ref 0–1)
Eosinophils Absolute: 0.2 10*3/uL (ref 0.0–0.7)
Eosinophils Relative: 2 % (ref 0–5)
Lymphocytes Relative: 28 % (ref 12–46)
Lymphs Abs: 2.8 10*3/uL (ref 0.7–4.0)
Monocytes Absolute: 0.8 10*3/uL (ref 0.1–1.0)
Monocytes Relative: 8 % (ref 3–12)
Neutro Abs: 6.3 10*3/uL (ref 1.7–7.7)
Neutrophils Relative %: 63 % (ref 43–77)

## 2011-04-07 LAB — CK TOTAL AND CKMB (NOT AT ARMC): Relative Index: INVALID (ref 0.0–2.5)

## 2011-04-07 MED ORDER — IOHEXOL 350 MG/ML SOLN
100.0000 mL | Freq: Once | INTRAVENOUS | Status: AC | PRN
  Administered 2011-04-07: 100 mL via INTRAVENOUS

## 2011-06-10 ENCOUNTER — Emergency Department (HOSPITAL_COMMUNITY)
Admission: EM | Admit: 2011-06-10 | Discharge: 2011-06-10 | Discharge status: Against Medical Advice | Payer: Medicare PPO | Attending: Emergency Medicine | Admitting: Emergency Medicine

## 2011-06-10 DIAGNOSIS — T50901A Poisoning by unspecified drugs, medicaments and biological substances, accidental (unintentional), initial encounter: Secondary | ICD-10-CM | POA: Insufficient documentation

## 2011-06-10 HISTORY — DX: Other chronic pain: G89.29

## 2011-06-10 NOTE — ED Notes (Signed)
Pt brought to er by ems for overdose, ut of his meds for pain x5 days, pain clinic wont give him more until seem by them later this month, took apporx 30 in last 24 hhours xanaflex--  Unsure how many he took.  Denies si/hi, :just wanted pain to go away"

## 2011-06-18 ENCOUNTER — Ambulatory Visit: Payer: 59 | Admitting: Family Medicine

## 2011-06-18 ENCOUNTER — Encounter: Payer: Self-pay | Admitting: Family Medicine

## 2011-06-20 ENCOUNTER — Emergency Department (HOSPITAL_COMMUNITY)
Admission: EM | Admit: 2011-06-20 | Discharge: 2011-06-20 | Disposition: A | Discharge status: Home / Self Care | Payer: Medicare PPO | Attending: Emergency Medicine | Admitting: Emergency Medicine

## 2011-06-20 ENCOUNTER — Emergency Department (HOSPITAL_COMMUNITY): Payer: Medicare PPO

## 2011-06-20 ENCOUNTER — Other Ambulatory Visit: Payer: Self-pay

## 2011-06-20 ENCOUNTER — Encounter (HOSPITAL_COMMUNITY): Payer: Self-pay

## 2011-06-20 DIAGNOSIS — Z950 Presence of cardiac pacemaker: Secondary | ICD-10-CM | POA: Insufficient documentation

## 2011-06-20 DIAGNOSIS — F17200 Nicotine dependence, unspecified, uncomplicated: Secondary | ICD-10-CM | POA: Insufficient documentation

## 2011-06-20 DIAGNOSIS — G8929 Other chronic pain: Secondary | ICD-10-CM | POA: Insufficient documentation

## 2011-06-20 DIAGNOSIS — R079 Chest pain, unspecified: Secondary | ICD-10-CM | POA: Insufficient documentation

## 2011-06-20 DIAGNOSIS — R0602 Shortness of breath: Secondary | ICD-10-CM | POA: Insufficient documentation

## 2011-06-20 LAB — DIFFERENTIAL
Basophils Absolute: 0 10*3/uL (ref 0.0–0.1)
Lymphocytes Relative: 25 % (ref 12–46)
Lymphs Abs: 3 10*3/uL (ref 0.7–4.0)
Monocytes Absolute: 1 10*3/uL (ref 0.1–1.0)
Neutro Abs: 8.1 10*3/uL — ABNORMAL HIGH (ref 1.7–7.7)

## 2011-06-20 LAB — CBC
HCT: 45.6 % (ref 39.0–52.0)
Hemoglobin: 15.4 g/dL (ref 13.0–17.0)
MCV: 92.7 fL (ref 78.0–100.0)
RBC: 4.92 MIL/uL (ref 4.22–5.81)
RDW: 13.9 % (ref 11.5–15.5)
WBC: 12.2 10*3/uL — ABNORMAL HIGH (ref 4.0–10.5)

## 2011-06-20 MED ORDER — RANITIDINE HCL 150 MG PO CAPS: 150.0000 mg | ORAL_CAPSULE | Freq: Every day | ORAL | Status: DC

## 2011-06-20 MED ORDER — NITROGLYCERIN 0.4 MG SL SUBL
0.4000 mg | SUBLINGUAL_TABLET | SUBLINGUAL | Status: DC | PRN
  Administered 2011-06-20: 0.4 mg via SUBLINGUAL
  Filled 2011-06-20: qty 25

## 2011-06-20 NOTE — ED Provider Notes (Signed)
History     CSN: [id number] Arrival date & time: 06/20/2011  4:10 AM  Chief Complaint  Patient presents with  . Chest Pain   HPI Comments: Patient states that he had acute onset of sharp left-sided chest pain which is worse with breathing which occurred tonight at rest at home. He is fairly sedentary but states that he is able to get up and walk to the mailbox every day. Every now and again he'll have a small amount of chest pain or shortness of breath that requires him to rest but overall has no change in his baseline functioning recently. Today he states that this has been intermittent lasting 3-4 minutes at a time and going away for 15-20 minutes between episodes. He states it is sharp, left-sided, had some radiation to his left arm. On arrival he states that the symptoms resolved after getting a nitroglycerin pill from nursing staff. Review of his medical record shows that he had a heart catheterization in 2009 which was free of any coronary disease. 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 He is not on anticoagulation therapy but does take several pain medications including 10 mg Percocets and oxymorphone, Lyrica, Zanaflex. Patient denies any travel, immobilization, surgery, trauma he has no history of having blood clots and other than his cigarette use he has no other significant risk factors for heart disease. He does have a family member that had a heart attack at the age of 50 and required stenting. It was this history and a prior history of chest pain 3 years ago similar to this during which time he states he had a heart catheterization.  Patient is a 45 y.o. male presenting with chest pain. The history is provided by the patient, a significant other and medical records.  Chest Pain The chest pain began 6 - 12 hours ago. Duration of episode(s) is 3 minutes. Chest pain occurs intermittently. The chest pain is unchanged. The pain is associated with  breathing. At its most intense, the pain is at 10/10. The pain is currently at 0/10. The severity of the pain is severe. The quality of the pain is described as sharp and stabbing. The pain radiates to the left arm. Chest pain is worsened by deep breathing. Primary symptoms include shortness of breath. Pertinent negatives for primary symptoms include no fever, no fatigue, no syncope, no cough, no wheezing, no palpitations, no abdominal pain, no nausea, no vomiting, no dizziness and no altered mental status.  Pertinent negatives for associated symptoms include no diaphoresis, no lower extremity edema, no numbness, no orthopnea, no paroxysmal nocturnal dyspnea and no weakness. He tried antacids for the symptoms. Risk factors include smoking/tobacco exposure.  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  Pertinent negatives for past medical history include no CAD, no cancer, no COPD, no MI and no PE.  Procedure history is positive for cardiac catheterization (Heart catheterization from 2009 showed normal coronary arteries and normal ejection fraction at 60% there).     Past Medical History  Diagnosis Date  . Seizures   . Chronic pain   . Pacemaker     Past Surgical History  Procedure Date  . Back surgery   . Neck surgery   . Knee arthroscopy     Family History  Problem Relation Age of Onset  . Hypertension Brother   . Heart attack Brother     History  Substance  Use Topics  . Smoking status: Current Everyday Smoker  . Smokeless tobacco: Not on file  . Alcohol Use: No      Review of Systems  Constitutional: Negative for fever, diaphoresis and fatigue.  Respiratory: Positive for shortness of breath. Negative for cough and wheezing.   Cardiovascular: Positive for chest pain. Negative for palpitations, orthopnea and syncope.  Gastrointestinal: Negative for nausea, vomiting and abdominal pain.    Neurological: Negative for dizziness, weakness and numbness.  Psychiatric/Behavioral: Negative for altered mental status.  All other systems reviewed and are negative.    Physical Exam  BP 144/99  Pulse 86  Temp(Src) 97.6 F (36.4 C) (Oral)  Ht 5\' 10"  (1.778 m)  Wt 160 lb (72.576 kg)  BMI 22.96 kg/m2  SpO2 97%  Physical Exam  Nursing note and vitals reviewed. Constitutional: He appears well-developed and well-nourished. No distress.  HENT:  Head: Normocephalic and atraumatic.  Mouth/Throat: Oropharynx is clear and moist. No oropharyngeal exudate.  Eyes: Conjunctivae and EOM are normal. Pupils are equal, round, and reactive to light. Right eye exhibits no discharge. Left eye exhibits no discharge. No scleral icterus.  Neck: Normal range of motion. Neck supple. No JVD present. No thyromegaly present.  Cardiovascular: Normal rate, regular rhythm, normal heart sounds and intact distal pulses.  Exam reveals no gallop and no friction rub.   No murmur heard. Pulmonary/Chest: Effort normal and breath sounds normal. No respiratory distress. He has no wheezes. He has no rales. He exhibits no tenderness.  Abdominal: Soft. Bowel sounds are normal. He exhibits no distension and no mass. There is no tenderness.  Musculoskeletal: Normal range of motion. He exhibits no edema and no tenderness.  Lymphadenopathy:    He has no cervical adenopathy.  Neurological: He is alert. Coordination normal.  Skin: Skin is warm and dry. No rash noted. No erythema.  Psychiatric: He has a normal mood and affect. His behavior is normal.    ED Course  Procedures  MDM Overall patient is well-appearing and chest pain-free at this time. He has an EKG which shows no ischemia, normal ST segments, no paced rhythm as he is only paced when senses bradycardia. The EKG is unchanged from prior EKGs compared to 04/07/2011. Will rule out coronary syndrome with cardiac markers however with 9 hours of constant chest pain,  normal EKG and presence of normal markers and history of normal heart catheterization in just the last 3 years would be very unlikely to be cardiac source.  His pain is described today exactly as it was in 2009 we had a heart catheterization. Patient has been chest pain-free for the remainder of his ER visit. His vital signs are normal with a pulse of 85 and a pressure of 126/75. His troponin level was negative and his chest x-ray is clear. He agrees that he wants to followup with his cardiologist this week.    ED ECG REPORT   Date: 06/20/2011   Rate: 84  Rhythm: normal sinus rhythm  QRS Axis: left  Intervals: normal  ST/T Wave abnormalities: normal  Conduction Disutrbances:none  Narrative Interpretation: has mild LVH now  Old EKG Reviewed: Left Axis deviation now   Vida Roller, MD 06/20/11 [phone]

## 2011-06-20 NOTE — ED Notes (Signed)
Pt reports cp that started last night around 8:30 pm, denies any cough/cold, +nausea, +sob, has taken no meds.

## 2011-06-20 NOTE — ED Notes (Signed)
EKG done at 0405 due to CP

## 2011-08-14 LAB — BASIC METABOLIC PANEL
CO2: 23
Calcium: 9.7
Creatinine, Ser: 1.04
GFR calc Af Amer: >60
Glucose, Bld: 125 — ABNORMAL HIGH

## 2011-08-14 LAB — URINE MICROSCOPIC-ADD ON

## 2011-08-14 LAB — URINALYSIS, ROUTINE W REFLEX MICROSCOPIC
Nitrite: NEGATIVE
Specific Gravity, Urine: >1.03 — ABNORMAL HIGH
Urobilinogen, UA: 1
pH: 6

## 2011-08-17 LAB — HEPARIN LEVEL (UNFRACTIONATED)
Heparin Unfractionated: 0.43 IU/mL (ref 0.30–0.70)
Heparin Unfractionated: 0.6 IU/mL (ref 0.30–0.70)

## 2011-08-17 LAB — BASIC METABOLIC PANEL
BUN: 7 mg/dL (ref 6–23)
BUN: 4 mg/dL — ABNORMAL LOW (ref 6–23)
BUN: 5 mg/dL — ABNORMAL LOW (ref 6–23)
BUN: 6 mg/dL (ref 6–23)
CO2: 27 mEq/L (ref 19–32)
CO2: 25 mEq/L (ref 19–32)
Calcium: 8.9 mg/dL (ref 8.4–10.5)
Calcium: 8.7 mg/dL (ref 8.4–10.5)
Calcium: 9 mg/dL (ref 8.4–10.5)
Calcium: 9.2 mg/dL (ref 8.4–10.5)
Calcium: 9.4 mg/dL (ref 8.4–10.5)
Chloride: 108 mEq/L (ref 96–112)
Chloride: 106 mEq/L (ref 96–112)
Creatinine, Ser: 0.8 mg/dL (ref 0.4–1.5)
Creatinine, Ser: 0.72 mg/dL (ref 0.4–1.5)
Creatinine, Ser: 0.73 mg/dL (ref 0.4–1.5)
GFR calc Af Amer: >60 mL/min (ref >60)
GFR calc Af Amer: >60 mL/min (ref >60)
GFR calc Af Amer: >60 mL/min (ref >60)
GFR calc non Af Amer: >60 mL/min (ref >60)
GFR calc non Af Amer: >60 mL/min (ref >60)
GFR calc non Af Amer: >60 mL/min (ref >60)
GFR calc non Af Amer: >60 mL/min (ref >60)
GFR calc non Af Amer: >60 mL/min (ref >60)
Glucose, Bld: 128 mg/dL — ABNORMAL HIGH (ref 70–99)
Glucose, Bld: 104 mg/dL — ABNORMAL HIGH (ref 70–99)
Glucose, Bld: 90 mg/dL (ref 70–99)
Glucose, Bld: 91 mg/dL (ref 70–99)
Potassium: 3.6 mEq/L (ref 3.5–5.1)
Potassium: 3.2 mEq/L — ABNORMAL LOW (ref 3.5–5.1)
Potassium: 3.8 mEq/L (ref 3.5–5.1)
Sodium: 140 mEq/L (ref 135–145)
Sodium: 140 mEq/L (ref 135–145)
Sodium: 141 mEq/L (ref 135–145)
Sodium: 143 mEq/L (ref 135–145)

## 2011-08-17 LAB — CARDIAC PANEL(CRET KIN+CKTOT+MB+TROPI)
CK, MB: 0.8 ng/mL (ref 0.3–4.0)
CK, MB: 0.8 ng/mL (ref 0.3–4.0)
Relative Index: INVALID (ref 0.0–2.5)
Total CK: 85 U/L (ref 7–232)
Troponin I: 0.01 ng/mL (ref 0.00–0.06)

## 2011-08-17 LAB — POCT CARDIAC MARKERS
CKMB, poc: <1 ng/mL — ABNORMAL LOW (ref 1.0–8.0)
Myoglobin, poc: 52.8 ng/mL (ref 12–200)
Troponin i, poc: <0.05 ng/mL (ref 0.00–0.09)

## 2011-08-17 LAB — DIFFERENTIAL
Basophils Absolute: 0 10*3/uL (ref 0.0–0.1)
Basophils Absolute: 0.1 10*3/uL (ref 0.0–0.1)
Basophils Relative: 0 % (ref 0–1)
Basophils Relative: 1 % (ref 0–1)
Eosinophils Absolute: 0.1 10*3/uL (ref 0.0–0.7)
Eosinophils Absolute: 0.2 10*3/uL (ref 0.0–0.7)
Eosinophils Relative: 1 % (ref 0–5)
Eosinophils Relative: 3 % (ref 0–5)
Lymphocytes Relative: 20 % (ref 12–46)
Lymphocytes Relative: 36 % (ref 12–46)
Lymphs Abs: 2 10*3/uL (ref 0.7–4.0)
Lymphs Abs: 2.1 10*3/uL (ref 0.7–4.0)
Monocytes Absolute: 0.4 10*3/uL (ref 0.1–1.0)
Monocytes Absolute: 0.5 10*3/uL (ref 0.1–1.0)
Monocytes Relative: 4 % (ref 3–12)
Monocytes Relative: 9 % (ref 3–12)
Neutro Abs: 7.3 10*3/uL (ref 1.7–7.7)
Neutro Abs: 3 10*3/uL (ref 1.7–7.7)
Neutrophils Relative %: 75 % (ref 43–77)
Neutrophils Relative %: 51 % (ref 43–77)

## 2011-08-17 LAB — CBC
HCT: 36.4 % — ABNORMAL LOW (ref 39.0–52.0)
HCT: 34.8 % — ABNORMAL LOW (ref 39.0–52.0)
HCT: 39.1 % (ref 39.0–52.0)
Hemoglobin: 12.3 g/dL — ABNORMAL LOW (ref 13.0–17.0)
Hemoglobin: 13 g/dL (ref 13.0–17.0)
MCHC: 33.8 g/dL (ref 30.0–36.0)
MCV: 92.9 fL (ref 78.0–100.0)
Platelets: 288 10*3/uL (ref 150–400)
Platelets: 281 10*3/uL (ref 150–400)
Platelets: 321 10*3/uL (ref 150–400)
Platelets: 347 10*3/uL (ref 150–400)
RBC: 3.92 MIL/uL — ABNORMAL LOW (ref 4.22–5.81)
RBC: 4.19 MIL/uL — ABNORMAL LOW (ref 4.22–5.81)
RDW: 13.5 % (ref 11.5–15.5)
RDW: 13.9 % (ref 11.5–15.5)
RDW: 14.1 % (ref 11.5–15.5)
RDW: 14.4 % (ref 11.5–15.5)
WBC: 9.9 10*3/uL (ref 4.0–10.5)
WBC: 4.8 10*3/uL (ref 4.0–10.5)
WBC: 5.8 10*3/uL (ref 4.0–10.5)

## 2011-08-17 LAB — HEMOGLOBIN A1C
Hgb A1c MFr Bld: 6 % (ref 4.6–6.1)
Mean Plasma Glucose: 126 mg/dL

## 2011-08-17 LAB — CK TOTAL AND CKMB (NOT AT ARMC)
Relative Index: INVALID (ref 0.0–2.5)
Total CK: 77 U/L (ref 7–232)

## 2011-08-17 LAB — URINALYSIS, ROUTINE W REFLEX MICROSCOPIC
Bilirubin Urine: NEGATIVE
Glucose, UA: NEGATIVE mg/dL
Hgb urine dipstick: NEGATIVE
Ketones, ur: NEGATIVE mg/dL
Nitrite: NEGATIVE
Protein, ur: NEGATIVE mg/dL
Specific Gravity, Urine: 1.02 (ref 1.005–1.030)
Urobilinogen, UA: 0.2 mg/dL (ref 0.0–1.0)
pH: 7 (ref 5.0–8.0)

## 2011-08-17 LAB — VITAMIN B12: Vitamin B-12: 267 pg/mL (ref 211–911)

## 2011-08-17 LAB — RETICULOCYTES
RBC.: 4.32 MIL/uL (ref 4.22–5.81)
Retic Count, Absolute: 56.2 10*3/uL (ref 19.0–186.0)

## 2011-08-17 LAB — LIPID PANEL
HDL: 33 mg/dL — ABNORMAL LOW (ref >39)
Total CHOL/HDL Ratio: 6.9 RATIO
Triglycerides: 161 mg/dL — ABNORMAL HIGH (ref <150)

## 2011-08-17 LAB — RAPID URINE DRUG SCREEN, HOSP PERFORMED
Amphetamines: NOT DETECTED
Barbiturates: NOT DETECTED
Benzodiazepines: NOT DETECTED
Cocaine: NOT DETECTED
Opiates: NOT DETECTED
Tetrahydrocannabinol: POSITIVE — AB

## 2011-08-17 LAB — PHOSPHORUS: Phosphorus: 3.2 mg/dL (ref 2.3–4.6)

## 2011-08-17 LAB — COMPREHENSIVE METABOLIC PANEL
ALT: 10 U/L (ref 0–53)
AST: 17 U/L (ref 0–37)
Alkaline Phosphatase: 76 U/L (ref 39–117)
Calcium: 8.6 mg/dL (ref 8.4–10.5)
GFR calc Af Amer: >60 mL/min (ref >60)
Potassium: 3.2 mEq/L — ABNORMAL LOW (ref 3.5–5.1)
Sodium: 140 mEq/L (ref 135–145)
Total Protein: 6.2 g/dL (ref 6.0–8.3)

## 2011-08-17 LAB — IRON AND TIBC
Iron: 48 ug/dL (ref 42–135)
Saturation Ratios: 11 % — ABNORMAL LOW (ref 20–55)

## 2011-08-17 LAB — TROPONIN I: Troponin I: 0.01 ng/mL (ref 0.00–0.06)

## 2011-08-17 LAB — FOLATE RBC: RBC Folate: 499 ng/mL (ref 180–600)

## 2011-08-22 LAB — CULTURE, BLOOD (ROUTINE X 2)
Culture: NO GROWTH
Culture: NO GROWTH

## 2011-08-22 LAB — DIFFERENTIAL
Basophils Relative: 1
Monocytes Absolute: 0.9 — ABNORMAL HIGH
Monocytes Relative: 9
Neutro Abs: 7.1

## 2011-08-22 LAB — CBC
Hemoglobin: 14.3
MCHC: 33.8
MCV: 93.5
RBC: 4.51

## 2011-11-16 ENCOUNTER — Emergency Department (HOSPITAL_COMMUNITY)
Admission: EM | Admit: 2011-11-16 | Discharge: 2011-11-16 | Disposition: A | Discharge status: Home / Self Care | Payer: Medicare Other | Attending: Emergency Medicine | Admitting: Emergency Medicine

## 2011-11-16 ENCOUNTER — Encounter (HOSPITAL_COMMUNITY): Payer: Self-pay | Admitting: Emergency Medicine

## 2011-11-16 ENCOUNTER — Emergency Department (HOSPITAL_COMMUNITY): Payer: Medicare Other

## 2011-11-16 DIAGNOSIS — S93409A Sprain of unspecified ligament of unspecified ankle, initial encounter: Secondary | ICD-10-CM | POA: Insufficient documentation

## 2011-11-16 DIAGNOSIS — Y93E1 Activity, personal bathing and showering: Secondary | ICD-10-CM | POA: Insufficient documentation

## 2011-11-16 DIAGNOSIS — M79609 Pain in unspecified limb: Secondary | ICD-10-CM | POA: Insufficient documentation

## 2011-11-16 DIAGNOSIS — Z79899 Other long term (current) drug therapy: Secondary | ICD-10-CM | POA: Insufficient documentation

## 2011-11-16 DIAGNOSIS — F17200 Nicotine dependence, unspecified, uncomplicated: Secondary | ICD-10-CM | POA: Insufficient documentation

## 2011-11-16 DIAGNOSIS — M25473 Effusion, unspecified ankle: Secondary | ICD-10-CM | POA: Insufficient documentation

## 2011-11-16 DIAGNOSIS — M25476 Effusion, unspecified foot: Secondary | ICD-10-CM | POA: Insufficient documentation

## 2011-11-16 DIAGNOSIS — X500XXA Overexertion from strenuous movement or load, initial encounter: Secondary | ICD-10-CM | POA: Insufficient documentation

## 2011-11-16 DIAGNOSIS — S93401A Sprain of unspecified ligament of right ankle, initial encounter: Secondary | ICD-10-CM

## 2011-11-16 NOTE — ED Notes (Signed)
Patient states he was getting out of the shower yesterday morning and fell, twisting his right ankle. Complaining of pain and difficulty walking on right foot.

## 2011-11-16 NOTE — ED Provider Notes (Signed)
History     CSN: [id number]  Arrival date & time 11/16/11  0129   First MD Initiated Contact with Patient 11/16/11 0134      Chief Complaint  Patient presents with  . Ankle Pain    (Consider location/radiation/quality/duration/timing/severity/associated sxs/prior treatment) Patient is a 46 y.o. male presenting with ankle pain. The history is provided by the patient.  Ankle Pain  The incident occurred yesterday. Pertinent negatives include no numbness.   patient states that yesterday morning he was getting out of the shower felt near syncope. He was trying not to fall and twisted his right ankle. He's had pain and difficulty walking the right foot since. He states his been some swelling. He states his chronic pain medications did not help with the pain. No other injuries. No numbness or weakness.  Past Medical History  Diagnosis Date  . Seizures   . Chronic pain   . Pacemaker   . Syncope     Past Surgical History  Procedure Date  . Back surgery   . Neck surgery   . Knee arthroscopy     Family History  Problem Relation Age of Onset  . Hypertension Brother   . Heart attack Brother     History  Substance Use Topics  . Smoking status: Current Everyday Smoker  . Smokeless tobacco: Not on file  . Alcohol Use: No      Review of Systems  Constitutional: Negative for fever.  Musculoskeletal: Positive for joint swelling. Negative for back pain.  Neurological: Negative for weakness and numbness.    Allergies  Penicillins; Sulfa drugs cross reactors; Toradol; and Tramadol  Home Medications   Current Outpatient Rx  Name Route Sig Dispense Refill  . OXYCODONE-ACETAMINOPHEN 10-325 MG PO TABS Oral Take 1 tablet by mouth every 4 (four) hours as needed.      Marland Kitchen OXYMORPHONE HCL 10 MG PO TABS Oral Take 10 mg by mouth every 4 (four) hours as needed.      Marland Kitchen PREGABALIN 100 MG PO CAPS Oral Take 600 mg by mouth 2 (two) times daily.      Marland Kitchen RANITIDINE HCL 150 MG PO CAPS Oral Take  1 capsule (150 mg total) by mouth daily. 30 capsule 0  . TIZANIDINE HCL 4 MG PO TABS Oral Take 4 mg by mouth every 6 (six) hours as needed.        BP 113/79  Pulse 67  Temp(Src) 98.6 F (37 C) (Oral)  Resp 14  Ht 5\' 10"  (1.778 m)  Wt 165 lb (74.844 kg)  BMI 23.68 kg/m2  SpO2 100%  Physical Exam  Constitutional: He appears well-developed.  Musculoskeletal:       Tender to right ankle medially. Mild swelling. No crepitance or deformity. Neurovascular intact distally. No tenderness over knee or proximal fibula.  Skin: No erythema.    ED Course  Procedures (including critical care time)  Labs Reviewed - No data to display Dg Ankle Complete Right  11/16/2011  *RADIOLOGY REPORT*  Clinical Data: The patient passed out and fell in the shower yesterday injuring right ankle.  Continued pain on weight bearing.  RIGHT ANKLE - COMPLETE 3+ VIEW  Comparison: None.  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  Well corticated old appearing osseous fragment posterior to the ankle joint.  This may represent ligamentous calcification.  No evidence of acute fracture or subluxation.  No focal bone lesion or bone destruction.  Ankle mortis and talar dome appear intact.  Bone cortex and trabecular architecture appear intact.  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  No evidence of acute fracture or subluxation.  Original Report Authenticated By: Marlon Pel, M.D.     1. Sprain of ankle, right       MDM  Patient has pain in his right ankle after twisting it. No acute fracture and x-ray. Patient has chronic pain medications. Given an ASO and crutches to be used as needed. Patient will followup with Dr. Hilda Lias, who is seen before, the pain is not proved in a week.        Juliet Rude. Rubin Payor, MD 11/16/11 1610

## 2011-11-24 ENCOUNTER — Encounter (HOSPITAL_COMMUNITY): Payer: Self-pay | Admitting: Emergency Medicine

## 2011-11-24 ENCOUNTER — Emergency Department (HOSPITAL_COMMUNITY)
Admission: EM | Admit: 2011-11-24 | Discharge: 2011-11-25 | Disposition: A | Discharge status: Other Acute Inpatient Hospital | Payer: Medicare Other | Attending: Emergency Medicine | Admitting: Emergency Medicine

## 2011-11-24 DIAGNOSIS — F3289 Other specified depressive episodes: Secondary | ICD-10-CM | POA: Insufficient documentation

## 2011-11-24 DIAGNOSIS — F17200 Nicotine dependence, unspecified, uncomplicated: Secondary | ICD-10-CM | POA: Insufficient documentation

## 2011-11-24 DIAGNOSIS — G8929 Other chronic pain: Secondary | ICD-10-CM | POA: Insufficient documentation

## 2011-11-24 DIAGNOSIS — Z9889 Other specified postprocedural states: Secondary | ICD-10-CM | POA: Insufficient documentation

## 2011-11-24 DIAGNOSIS — F329 Major depressive disorder, single episode, unspecified: Secondary | ICD-10-CM

## 2011-11-24 DIAGNOSIS — R45851 Suicidal ideations: Secondary | ICD-10-CM

## 2011-11-24 DIAGNOSIS — Z950 Presence of cardiac pacemaker: Secondary | ICD-10-CM | POA: Insufficient documentation

## 2011-11-24 DIAGNOSIS — Z79899 Other long term (current) drug therapy: Secondary | ICD-10-CM | POA: Insufficient documentation

## 2011-11-24 HISTORY — DX: Depression, unspecified: F32.A

## 2011-11-24 HISTORY — DX: Major depressive disorder, single episode, unspecified: F32.9

## 2011-11-24 LAB — CBC
MCH: 30.8 pg (ref 26.0–34.0)
MCHC: 33.3 g/dL (ref 30.0–36.0)
Platelets: 298 10*3/uL (ref 150–400)
RBC: 4.87 MIL/uL (ref 4.22–5.81)
RDW: 12.9 % (ref 11.5–15.5)

## 2011-11-24 LAB — RAPID URINE DRUG SCREEN, HOSP PERFORMED
Benzodiazepines: NOT DETECTED
Cocaine: NOT DETECTED
Opiates: NOT DETECTED

## 2011-11-24 LAB — BASIC METABOLIC PANEL
GFR calc Af Amer: >90 mL/min (ref >90)
GFR calc non Af Amer: >90 mL/min (ref >90)
Potassium: 3.6 mEq/L (ref 3.5–5.1)
Sodium: 145 mEq/L (ref 135–145)

## 2011-11-24 LAB — DIFFERENTIAL
Basophils Absolute: 0 10*3/uL (ref 0.0–0.1)
Basophils Relative: 1 % (ref 0–1)
Eosinophils Absolute: 0.2 10*3/uL (ref 0.0–0.7)
Lymphs Abs: 1.8 10*3/uL (ref 0.7–4.0)
Neutrophils Relative %: 64 % (ref 43–77)

## 2011-11-24 MED ORDER — PREGABALIN 75 MG PO CAPS
600.0000 mg | ORAL_CAPSULE | Freq: Two times a day (BID) | ORAL | Status: DC
Start: 2011-11-24 — End: 2011-11-24

## 2011-11-24 MED ORDER — PREGABALIN 75 MG PO CAPS
300.0000 mg | ORAL_CAPSULE | Freq: Two times a day (BID) | ORAL | Status: DC
  Administered 2011-11-24: 300 mg via ORAL
  Filled 2011-11-24: qty 4

## 2011-11-24 MED ORDER — IBUPROFEN 400 MG PO TABS: 600.0000 mg | ORAL_TABLET | Freq: Three times a day (TID) | ORAL | Status: DC | PRN

## 2011-11-24 MED ORDER — ACETAMINOPHEN 325 MG PO TABS: 650.0000 mg | ORAL_TABLET | ORAL | Status: DC | PRN

## 2011-11-24 MED ORDER — LORAZEPAM 1 MG PO TABS: 1.0000 mg | ORAL_TABLET | Freq: Three times a day (TID) | ORAL | Status: DC | PRN

## 2011-11-24 MED ORDER — TIZANIDINE HCL 4 MG PO TABS
4.0000 mg | ORAL_TABLET | Freq: Four times a day (QID) | ORAL | Status: DC | PRN
  Administered 2011-11-25: 4 mg via ORAL
  Filled 2011-11-24: qty 1

## 2011-11-24 MED ORDER — OXYCODONE-ACETAMINOPHEN 5-325 MG PO TABS
2.0000 | ORAL_TABLET | ORAL | Status: DC | PRN
  Administered 2011-11-24 (×2): 2 via ORAL
  Filled 2011-11-24 (×2): qty 2

## 2011-11-24 MED ORDER — FAMOTIDINE 20 MG PO TABS
20.0000 mg | ORAL_TABLET | Freq: Two times a day (BID) | ORAL | Status: DC
  Administered 2011-11-24: 20 mg via ORAL
  Filled 2011-11-24 (×2): qty 1

## 2011-11-24 MED ORDER — NICOTINE 21 MG/24HR TD PT24
21.0000 mg | MEDICATED_PATCH | Freq: Every day | TRANSDERMAL | Status: DC | PRN
  Administered 2011-11-24: 21 mg via TRANSDERMAL
  Filled 2011-11-24: qty 1

## 2011-11-24 MED ORDER — ZOLPIDEM TARTRATE 5 MG PO TABS: 5.0000 mg | ORAL_TABLET | Freq: Every evening | ORAL | Status: DC | PRN

## 2011-11-24 MED ORDER — MORPHINE SULFATE 30 MG PO TABS: 30.0000 mg | ORAL_TABLET | ORAL | Status: DC | PRN

## 2011-11-24 MED ORDER — TIZANIDINE HCL 4 MG PO TABS
ORAL_TABLET | ORAL | Status: AC
  Filled 2011-11-24: qty 1

## 2011-11-24 MED ORDER — ONDANSETRON HCL 4 MG PO TABS: 4.0000 mg | ORAL_TABLET | Freq: Three times a day (TID) | ORAL | Status: DC | PRN

## 2011-11-24 NOTE — ED Notes (Signed)
Pt to family room with katherine from [facility], security aware.  And to room .

## 2011-11-24 NOTE — ED Notes (Signed)
Pt reports feeling very depressed and having SI. PT also c/o pain to r ankle from injury.  Foot in cast. Pt can wiggle toes, capillary refill wnl.

## 2011-11-24 NOTE — BH Assessment (Signed)
Assessment Note   Isaac Jensen is an 46 y.o. male. PT PRESENTS WITH INCREASE DEPRESSION, SUICIDAL THOUGHT WITH SEVERAL PLANS. PT SAYS HE HAS BEEN HAVING SUICIDAL THOUGHTS & PLANING ON WAYS TO KILL SELF SINCE 11/17/2012WHEN HIS MOTHER DIED. PT SAYS HE BLAMES HIS STEP FATHER FOR MOM'S DEATH & CANT STAND TO SEE HIM. PT EXPRESSED THAT HE RATHER BE DEATH THAN DEAL WITH STEP DAD. PT IS EMOTIONAL & TEARFUL. PT IS UNABLE TO CONTRACT FOR SAFETY & SAYS HE FEELS LIKE NOTHING IS GOING RIGHT FOR HIM. PT ADMITS TO FAMILY CONFLICT & MARITAL ISSUES; SAYS HE CAN JUMP OF BRIDGE OR CUT THROAT OR SHOOT SELF SO HE WONT HAVE TO DEAL WITH ANYTHING. PT SAYS HE IS OVERWHELMED WITH STRESS WHICH INCLUDE MARITAL, FAMILY & MEDICAL ISSUES. PT SAYS HE HAS NOT SUPPORT & HIS BEST IS NOT GOOD ENOUGH PT DENIES ANY DRUG USE ACCEPT FOR NEW YEARS DAY WHEN HE SMOKED A JOINT WITH A FRIEND WHICH WAS HIS FIRST TIME. PT SAYS HE IS BEING FOLLOW BY A MANAGEMENT CLINIC FOR CHRONIC BACK PAIN BUT DOES NOT HAVE A PROVIDER. PT HAS BEEN REFERRED TO [facility] FOR ADMISSION & IS PENDING DISPOSITION.  Axis I: Major Depression, Recurrent severe Axis II: Deferred Axis III:  Past Medical History  Diagnosis Date  . Seizures   . Chronic pain   . Pacemaker   . Syncope   . Syncope   . Depression    Axis IV: economic problems, other psychosocial or environmental problems, problems related to social environment and problems with primary support group Axis V: 1-10 persistent dangerousness to self and others present  Past Medical History:  Past Medical History  Diagnosis Date  . Seizures   . Chronic pain   . Pacemaker   . Syncope   . Syncope   . Depression     Past Surgical History  Procedure Date  . Back surgery   . Neck surgery   . Knee arthroscopy     Family History:  Family History  Problem Relation Age of Onset  . Hypertension Brother   . Heart attack Brother     Social History:  reports that he has been smoking Cigarettes.  He  has a 90 pack-year smoking history. He has never used smokeless tobacco. He reports that he uses illicit drugs (Marijuana). He reports that he does not drink alcohol.  Additional Social History:    Allergies:  Allergies  Allergen Reactions  . Penicillins   . Sulfa Drugs Cross Reactors   . Toradol   . Tramadol     Home Medications:  Medications Prior to Admission  Medication Dose Route Frequency Provider Last Rate Last Dose  . acetaminophen (TYLENOL) tablet 650 mg  650 mg Oral Q4H PRN Flint Melter, MD      . famotidine (PEPCID) tablet 20 mg  20 mg Oral BID Flint Melter, MD      . ibuprofen (ADVIL,MOTRIN) tablet 600 mg  600 mg Oral Q8H PRN Flint Melter, MD      . LORazepam (ATIVAN) tablet 1 mg  1 mg Oral Q8H PRN Flint Melter, MD      . morphine (MSIR) tablet 30 mg  30 mg Oral Q4H PRN Flint Melter, MD      . nicotine (NICODERM CQ - dosed in mg/24 hours) patch 21 mg  21 mg Transdermal Daily PRN Flint Melter, MD      . ondansetron [facility]) tablet 4  mg  4 mg Oral Q8H PRN Flint Melter, MD      . oxyCODONE-acetaminophen (PERCOCET) 5-325 MG per tablet 2 tablet  2 tablet Oral Q4H PRN Flint Melter, MD   2 tablet at 11/24/11 1920  . pregabalin (LYRICA) capsule 600 mg  600 mg Oral BID Flint Melter, MD      . tiZANidine (ZANAFLEX) tablet 4 mg  4 mg Oral Q6H PRN Flint Melter, MD      . zolpidem (AMBIEN) tablet 5 mg  5 mg Oral QHS PRN Flint Melter, MD       Medications Prior to Admission  Medication Sig Dispense Refill  . oxyCODONE-acetaminophen (PERCOCET) 10-325 MG per tablet Take 1 tablet by mouth every 4 (four) hours as needed.        Marland Kitchen oxymorphone (OPANA) 10 MG tablet Take 10 mg by mouth every 4 (four) hours as needed.        . ranitidine (ZANTAC) 150 MG capsule Take 1 capsule (150 mg total) by mouth daily.  30 capsule  0  . tiZANidine (ZANAFLEX) 4 MG tablet Take 4 mg by mouth every 6 (six) hours as needed.         OB/GYN Status:  No LMP for male  patient.  General Assessment Data Location of Assessment: [facility] Assessment: Yes Living Arrangements: Spouse/significant other Can pt return to current living arrangement?: Yes Admission Status: Voluntary Is patient capable of signing voluntary admission?: Yes Transfer from: Acute Hospital Referral Source: Self/Family/Friend     Risk to self Suicidal Ideation: Yes-Currently Present Suicidal Intent: Yes-Currently Present Is patient at risk for suicide?: Yes Suicidal Plan?: Yes-Currently Present Specify Current Suicidal Plan: TO JUMP OFF BRIDGE, SHOOT SELF OR CUT THROAT Access to Means: Yes Specify Access to Suicidal Means: GUN, KNIFE, BRIDGE What has been your use of drugs/alcohol within the last 12 months?: PT USED THC ONCE ON NEW YEARS DAY Previous Attempts/Gestures: Yes How many times?: 1  Other Self Harm Risks: NA Triggers for Past Attempts: Family contact;Spouse contact;Other personal contacts;Other (Comment) (GRIEF) Intentional Self Injurious Behavior: Cutting Family Suicide History: Unknown Recent stressful life event(s): Conflict (Comment);Financial Problems;Other (Comment) (MEDICAL & MARITAL ISSUES) Persecutory voices/beliefs?: No Depression: Yes Depression Symptoms: Loss of interest in usual pleasures;Feeling worthless/self pity;Tearfulness;Isolating;Insomnia;Feeling angry/irritable Substance abuse history and/or treatment for substance abuse?: No Suicide prevention information given to non-admitted patients: Not applicable  Risk to Others Homicidal Ideation: No Thoughts of Harm to Others: No Current Homicidal Intent: No Current Homicidal Plan: No Access to Homicidal Means: No Identified Victim: NA History of harm to others?: No Assessment of Violence: None Noted Violent Behavior Description: CALM, COOPERATIVE, EMOTIONAL & TEARFUL Does patient have access to weapons?: Yes (Comment) Criminal Charges Pending?: No Does patient have a court date:  No  Psychosis Hallucinations: None noted Delusions: None noted  Mental Status Report Appear/Hygiene: Improved Eye Contact: Good Motor Activity: Freedom of movement Speech: Soft;Slow;Logical/coherent Level of Consciousness: Alert Mood: Depressed;Despair;Helpless;Irritable;Sad Affect: Appropriate to circumstance;Depressed;Sad Anxiety Level: None Thought Processes: Coherent;Relevant Judgement: Unimpaired Orientation: Person;Place;Time;Situation Obsessive Compulsive Thoughts/Behaviors: None  Cognitive Functioning Concentration: Decreased Memory: Recent Intact;Remote Intact IQ: Average Insight: Poor Impulse Control: Poor Appetite: Fair Weight Loss: 0  Weight Gain: 0  Sleep: Decreased Total Hours of Sleep: 5  Vegetative Symptoms: None  Prior Inpatient Therapy Prior Inpatient Therapy: Yes Prior Therapy Dates: 2010 Prior Therapy Facilty/Provider(s): [facility] Reason for Treatment: STABILIZATION  Prior Outpatient Therapy Prior Outpatient Therapy: No Prior Therapy Dates: NA Prior Therapy  Facilty/Provider(s): NA Reason for Treatment: NA            Values / Beliefs Cultural Requests During Hospitalization: None Spiritual Requests During Hospitalization: None        Additional Information 1:1 In Past 12 Months?: No CIRT Risk: No Elopement Risk: No Does patient have medical clearance?: Yes     Disposition:  Disposition Disposition of Patient: Inpatient treatment program;Referred to ([facility]) Type of inpatient treatment program: Adult  On Site Evaluation by:   Reviewed with Physician:     Waldron Session 11/24/2011 11:17 PM
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
## 2011-11-24 NOTE — ED Notes (Signed)
Patient requesting to speak to someone. Patient having sucidal thoughts. Per patient has had thoughts of harming self since mother died in 10-02-23. Patient reports being seen [facility] after cutting both arms. Patient stopped by police today on bridge. Patient had thoughts of jumping of bridge. Denies any homicidal thoughts.

## 2011-11-24 NOTE — ED Notes (Signed)
Pt in blue scrubs, wanded by security, sitter at bedside.

## 2011-11-25 ENCOUNTER — Inpatient Hospital Stay (HOSPITAL_COMMUNITY)
Admission: AD | Admit: 2011-11-25 | Discharge: 2011-11-27 | DRG: 885 | Disposition: A | Discharge status: Home / Self Care | Payer: No Typology Code available for payment source | Attending: Psychiatry | Admitting: Psychiatry

## 2011-11-25 ENCOUNTER — Encounter (HOSPITAL_COMMUNITY): Payer: Self-pay

## 2011-11-25 DIAGNOSIS — F331 Major depressive disorder, recurrent, moderate: Principal | ICD-10-CM

## 2011-11-25 DIAGNOSIS — Z950 Presence of cardiac pacemaker: Secondary | ICD-10-CM

## 2011-11-25 DIAGNOSIS — Z818 Family history of other mental and behavioral disorders: Secondary | ICD-10-CM

## 2011-11-25 DIAGNOSIS — Z79899 Other long term (current) drug therapy: Secondary | ICD-10-CM

## 2011-11-25 DIAGNOSIS — R45851 Suicidal ideations: Secondary | ICD-10-CM

## 2011-11-25 DIAGNOSIS — M549 Dorsalgia, unspecified: Secondary | ICD-10-CM

## 2011-11-25 DIAGNOSIS — G8929 Other chronic pain: Secondary | ICD-10-CM

## 2011-11-25 LAB — HEPATIC FUNCTION PANEL
ALT: 23 U/L (ref 0–53)
Alkaline Phosphatase: 95 U/L (ref 39–117)
Bilirubin, Direct: <0.1 mg/dL (ref 0.0–0.3)

## 2011-11-25 MED ORDER — PREGABALIN 75 MG PO CAPS: 300.0000 mg | ORAL_CAPSULE | Freq: Two times a day (BID) | ORAL | Status: DC

## 2011-11-25 MED ORDER — PREGABALIN 75 MG PO CAPS
300.0000 mg | ORAL_CAPSULE | Freq: Two times a day (BID) | ORAL | Status: DC
  Filled 2011-11-25: qty 3

## 2011-11-25 MED ORDER — PREGABALIN 75 MG PO CAPS
300.0000 mg | ORAL_CAPSULE | Freq: Once | ORAL | Status: AC
  Administered 2011-11-25: 300 mg via ORAL

## 2011-11-25 MED ORDER — PREGABALIN 75 MG PO CAPS
300.0000 mg | ORAL_CAPSULE | Freq: Two times a day (BID) | ORAL | Status: DC
  Administered 2011-11-26 – 2011-11-27 (×3): 300 mg via ORAL
  Filled 2011-11-25: qty 3
  Filled 2011-11-25 (×4): qty 4

## 2011-11-25 MED ORDER — TIZANIDINE HCL 2 MG PO TABS
4.0000 mg | ORAL_TABLET | Freq: Four times a day (QID) | ORAL | Status: DC | PRN
  Administered 2011-11-25 – 2011-11-27 (×6): 4 mg via ORAL
  Filled 2011-11-25 (×6): qty 2

## 2011-11-25 MED ORDER — INFLUENZA VIRUS VACC SPLIT PF IM SUSP
0.5000 mL | INTRAMUSCULAR | Status: AC
  Administered 2011-11-26: 0.5 mL via INTRAMUSCULAR

## 2011-11-25 MED ORDER — MAGNESIUM HYDROXIDE 400 MG/5ML PO SUSP: 30.0000 mL | Freq: Every day | ORAL | Status: DC | PRN

## 2011-11-25 MED ORDER — OXYCODONE-ACETAMINOPHEN 5-325 MG PO TABS: 1.0000 | ORAL_TABLET | Freq: Three times a day (TID) | ORAL | Status: DC | PRN

## 2011-11-25 MED ORDER — ALUM & MAG HYDROXIDE-SIMETH 200-200-20 MG/5ML PO SUSP: 30.0000 mL | ORAL | Status: DC | PRN

## 2011-11-25 MED ORDER — NICOTINE 21 MG/24HR TD PT24
21.0000 mg | MEDICATED_PATCH | Freq: Every day | TRANSDERMAL | Status: DC
  Administered 2011-11-25 – 2011-11-27 (×3): 21 mg via TRANSDERMAL
  Filled 2011-11-25 (×5): qty 1

## 2011-11-25 MED ORDER — OXYCODONE HCL 5 MG PO TABS
10.0000 mg | ORAL_TABLET | Freq: Three times a day (TID) | ORAL | Status: DC | PRN
  Administered 2011-11-25 – 2011-11-27 (×5): 10 mg via ORAL
  Filled 2011-11-25 (×5): qty 2

## 2011-11-25 MED ORDER — OXYCODONE-ACETAMINOPHEN 10-325 MG PO TABS: 1.0000 | ORAL_TABLET | Freq: Three times a day (TID) | ORAL | Status: DC | PRN

## 2011-11-25 MED ORDER — DULOXETINE HCL 60 MG PO CPEP
60.0000 mg | ORAL_CAPSULE | Freq: Every day | ORAL | Status: DC
  Administered 2011-11-25 – 2011-11-27 (×3): 60 mg via ORAL
  Filled 2011-11-25 (×5): qty 1
  Filled 2011-11-25: qty 14

## 2011-11-25 MED ORDER — ACETAMINOPHEN 325 MG PO TABS: 650.0000 mg | ORAL_TABLET | Freq: Four times a day (QID) | ORAL | Status: DC | PRN

## 2011-11-25 MED ORDER — OXYMORPHONE HCL 10 MG PO TABS
10.0000 mg | ORAL_TABLET | Freq: Three times a day (TID) | ORAL | Status: DC
  Filled 2011-11-25 (×3): qty 1

## 2011-11-25 MED ORDER — OXYCODONE HCL 5 MG PO TABS: 5.0000 mg | ORAL_TABLET | Freq: Three times a day (TID) | ORAL | Status: DC | PRN

## 2011-11-25 NOTE — H&P (Signed)
Pt was seen today 1;1. I agree with the key elements in NP's  H&P note dated today.    Pt was seen today 1;1. I agree with the key elements in NP's H&P note dated today.

## 2011-11-25 NOTE — H&P (Signed)
Identifying information: 46 year old Caucasian male, married, this is a voluntary admission.  History of present illness:  This is one of several admissions for Isaac Jensen who was on our unit the last time in 2010 after a suicide attempt by cutting his arm. He presents this time complaining of an exacerbation of depression starting in 09-16-11 after the death of his mother. Since that time his mood has become progressively lower, suffering significant anhedonia, crying spells on a daily basis for the past 2 weeks, and for the past 2 weeks frequently sits in his chair in a darkened closets and cries and thinks about his mother. He feels unable to get past her death. 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 She died in 11/04/2012at the age of 62 from COPD.  Isaac Jensen has been having suicidal thoughts and began to think about a plan but has made no attempts. He was thinking he might stab himself or shoot himself.  Past psychiatric history.  He is currently receiving no outpatient treatment. Isaac Jensen has a history of several admissions over the years for depression. Most recently he was prescribed Cymbalta which she feels worked well for his depression he stopped taking it after his primary care physician was no longer following him. He does have a history of previous suicide attempts. Most recent was by cutting in 2010.  Family history: Several family members with depression.  Medical evaluation: This is a normally developed Caucasian male, Slim build, fully alert and in no physical distress. He is adequately nourished and hydrated. Full physical exam was done the emergency room is noted there. I've medically and physically evaluated this patient today in my findings are consistent with those in the emergency room. He is wearing a cast on his R foot and ankle.  Diagnostic studies performed in the emergency room revealed alcohol screen  negative, urine drug screen negative for all substances, a CBC and cemented unremarkable.  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 Chronic back pain due to DDD, with history of cervical fusion. Recent right ankle injury with casting. Past medical history significant for history of syncope with bradycardia and junctional rhythm, now with pacemaker in place.  Admitting medications: Opana 10 mg every 8 hours, for chronic back pain Oxycodone 10/325 mg, one up to twice daily as needed for breakthrough back pain. Pregabalin 300 mg twice daily, for neuropathic pain ZanaFlex 4 mg q 6 hrs prn for muscle spasms.   Mental status exam: This is a fully alert male, pleasant, cooperative. Affect is blunted and he is tearful at times talking about his mother. Thinking is logical and coherent, no intent or plan for suicide today though he continues to have some passive suicidal thoughts, wondering why he is on her. He is asking for help with his depression and feels the Cymbalta worked well for him in the past. Thinking is nonpsychotic insight adequate, and impulse control, and judgment are normal. He is fully oriented with an adequate fund of knowledge and cognition fully intact.  Admitting diagnosis: Axis I: Maj. depression recurrent severe Axis II: No diagnosis Axis III: Chronic back pain, Pacemaker in place, right ankle injury patient is wearing a cast. Axis IV: Grief and loss issues. Axis V: Current 40 past year not known  Plan: We'll restart his Cymbalta at 60 mg daily, will continue his routine medications, and will consider referring him for  followup at [facility] recovery services and [facility].

## 2011-11-25 NOTE — Progress Notes (Signed)
[facility] Group Notes:  (Counselor/Nursing/MHT/Case Management/Adjunct)  11/25/2011 6:29 PM  Type of Therapy:  Group Therapy  Pt. Did not attend group session.   Isaac Jensen [place] 11/25/2011, 6:29 PM

## 2011-11-25 NOTE — ED Notes (Signed)
Report called to joann at [facility].  Pt awaiting transport

## 2011-11-25 NOTE — ED Notes (Signed)
Sitting up on stretcher, sitter at bedside. Voices no complaints

## 2011-11-25 NOTE — Progress Notes (Addendum)
Suicide Risk Assessment  Admission Assessment     Demographic factors:  Assessment Details Time of Assessment: Admission Information Obtained From: Patient Current Mental Status:  Current Mental Status: Suicidal ideation indicated by patient;Suicide plan  fully alert male, pleasant, cooperative. Affect is blunted and he is tearful at times talking about his mother. Thinking is logical and coherent, no intent or plan for suicide today though he continues to have some passive suicidal thoughts, wondering why he is on her. He is asking for help with his depression and feels the Cymbalta worked well for him in the past. Insight limited and impulse control, and judgment are normal. He is fully oriented with an adequate fund of knowledge and cognition fully intact.    Loss Factors:  Loss Factors: Loss of significant relationship;Decline in physical health Historical Factors:  Historical Factors: Prior suicide attempts;Anniversary of important loss Risk Reduction Factors:  Risk Reduction Factors: Living with another person, especially a relative  CLINICAL FACTORS:   Chronic Pain 1 si attempt in past Poor sleep, feeling depressed COGNITIVE FEATURES THAT CONTRIBUTE TO RISK:  Closed-mindedness    SUICIDE RISK:   Moderate:  Frequent suicidal ideation with limited intensity, and duration, some specificity in terms of plans, no associated intent, good self-control, limited dysphoria/symptomatology, some risk factors present, and identifiable protective factors, including available and accessible social support.  PLAN OF CARE: restart his Cymbalta at 60 mg daily, will continue his routine medications, and will consider referring him for followup at [facility] recovery services and [facility].  Wonda Cerise 11/25/2011, 7:05 PM

## 2011-11-25 NOTE — ED Notes (Signed)
Pt awaiting [facility], given sandwich and coke in cup. Sitter remains at bedside.

## 2011-11-25 NOTE — ED Notes (Signed)
[facility] arrived, report given --1 bag of belongings given to [facility]
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
## 2011-11-25 NOTE — BH Assessment (Signed)
PT HAS BEEN ACCEPTED TO [facility] IN THE CARE OF DR. Orson Aloe. PT WILL BE GOING TO 505-2. PT HAS SIGNED SUPPORT PAPER WORK. & READY FOR TRANSFER.
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
## 2011-11-25 NOTE — Progress Notes (Signed)
Pt attending the group today, sat there and did not engage very much. Has been very patient with the process of getting his pain medications, but reports that he has been in a lot of pain and has not had his pain meds since last night. States that he is feels depressed. Has had a lot of of loses and is just having lots of feelings about how to handle the losses. Has interacted with his peers some. Presently denies SI and HI in the hospital. Given support and reassurance.

## 2011-11-26 LAB — TSH: TSH: 0.812 u[IU]/mL (ref 0.350–4.500)

## 2011-11-26 NOTE — Progress Notes (Signed)
Patient seen during d/c planning group.  He reports becoming suicidal due to mother's death in 10/05/11.  Patient advised he cared for his mother for 12 years prior to her death and does not know what to do with himself.  He advised of being married for 12 years and plans to return to the home he shares with his wife.  He will need follow up with [facility] in [facility].  He currently denies SI/HI.  Suicide prevention education reviewed during group.

## 2011-11-26 NOTE — Progress Notes (Signed)
[facility] Group Notes: (Counselor/Nursing/MHT/Case Management/Adjunct) 11/26/2011   @  11:00am   Type of Therapy:  Group Therapy  Participation Level:  Limited  Participation Quality:  Attentive  Affect:  Blunted  Cognitive:  Appropriate  Insight:  Limited  Engagement in Group: Limited  Engagement in Therapy:  Limited  Modes of Intervention:  Support and Exploration  Summary of Progress/Problems: [place]  was attentive but not very engaged in group process. He seemed to relate well to others in group who processed their experiences.     [name] 11/26/2011 2:38 PM

## 2011-11-26 NOTE — Tx Team (Signed)
Interdisciplinary Treatment Plan Update (Adult)  Date:  11/26/2011  Time Reviewed:  10:22 AM   Progress in Treatment: Attending groups:   Yes   Participating in groups:  Yes Taking medication as prescribed:  Yes Tolerating medication:  Yes Family/Significant othe contact made:  Patient understands diagnosis:  Yes Discussing patient identified problems/goals with staff: Yes Medical problems stabilized or resolved: Yes Denies suicidal/homicidal ideation:Yes Issues/concerns per patient self-inventory:  Other:  New problem(s) identified:  Reason for Continuation of Hospitalization: Anxiety Depression Medication stabilization  Interventions implemented related to continuation of hospitalization:  Medication Management; safety checks q 15 mins  Additional comments:  Suicide prevention education reviewed.  Estimated length of stay: 2-3 days  Discharge Plan:  Home with wife - Outpatient follow up with [facility]  New goal(s):  Review of initial/current patient goals per problem list:    1.  Goal(s):.Eliminate SI   Met:  Yes  Target date:  d/c  As evidenced by:  Isaac Jensen denies SI today  2.  Goal (s):  Reduce Anxiety  Met:  No  Target date: d/c  As evidenced by:  Lower rating than seven for anxiety   3.  Goal(s):  Stabilize on medications  Met:  No  Target date: d/c  As evidenced by:  Isaac Jensen will report medications are working  4.  Goal(s):  Refer for outpatient follow up  Met:  No  Target date: d/c  As evidenced by:  Follow up with [facility] will be in place  Attendees: Patient:     Family:      11/26/2011 10:22 AM   Nursing:    11/26/2011 10:22 AM   CaseManager:  Juline Patch, LCSW 11/26/2011 10:22 AM   Counselor:  Angus Palms, LCSW 11/26/2011 10:22 AM   Other:  Consuello Bossier, NP 11/26/2011 10:22 AM   Other:  Reyes Ivan, LCSWA 11/26/2011  10:22 AM   Other:     Other:      Scribe for Treatment Team:   Wynn Banker, LCSW,  11/26/2011  10:22 AM

## 2011-11-26 NOTE — Progress Notes (Signed)
[facility] MD Progress Note  11/26/2011 11:07 PM  "I am at lost as to what to do with myself since the passing of my mother last October. I got used to taking care of her. I miss her a lot. Caring for my mother gave me a purpose then. My depression deepens since her passing. I started thinking about suicide a lot. I thought about may, stab myself"  Diagnosis:   Axis I: Major depressive disorder, recurrent Axis II: Deferred Axis III:  Past Medical History  Diagnosis Date  . Seizures   . Chronic pain   . Pacemaker   . Syncope   . Syncope   . Depression    Axis IV: No chnages Axis V: 41-50 serious symptoms  ADL's:  Intact  Sleep:  Yes,  AEB: 6.25  Appetite:  Yes,  AEB: "My appetite is good"  Suicidal Ideation:   Plan:  No  Intent:  No  Means:  No  Homicidal Ideation:   Plan:  No  Intent:  No  Means:  No  AEB (as evidenced by): Per patient's report  Mental Status: General Appearance Isaac Jensen:  Casual Eye Contact:  Good Motor Behavior:  Normal Speech:  Normal Level of Consciousness:  Alert Mood:  Depressed Affect:  Blunt Anxiety Level:  Minimal Thought Process:  Coherent Thought Content:  WNL Perception:  Normal Judgment:  Fair Insight:  Present Cognition:  Orientation time, place and person Sleep:  Number of Hours: 6.25   Vital Signs:Blood pressure 134/85, pulse 87, temperature 97.3 F (36.3 C), temperature source Oral, resp. rate 16, height 5\' 10"  (1.778 m), weight 80.74 kg (178 lb).  Lab Results:  Results for orders placed during the hospital encounter of 11/25/11 (from the past 48 hour(s))  HEPATIC FUNCTION PANEL     Status: Abnormal   Collection Time   11/25/11  7:40 PM      Component Value Range Comment   Total Protein 6.7  6.0 - 8.3 (g/dL)    Albumin 3.6  3.5 - 5.2 (g/dL)    AST 20  0 - 37 (U/L)    ALT 23  0 - 53 (U/L)    Alkaline Phosphatase 95  39 - 117 (U/L)    Total Bilirubin 0.2 (*) 0.3 - 1.2 (mg/dL)    Bilirubin, Direct <1.6  0.0 - 0.3  (mg/dL)    Indirect Bilirubin NOT CALCULATED  0.3 - 0.9 (mg/dL)   TSH     Status: Normal   Collection Time   11/25/11  7:40 PM      Component Value Range Comment   TSH 0.812  0.350 - 4.500 (uIU/mL)     Physical Findings: AIMS:  , ,  ,  ,    CIWA:    COWS:     Treatment Plan Summary: Daily contact with patient to assess and evaluate symptoms and progress in treatment Medication management  Plan: Continue current treatment plan.           Continue group therapy and activities. Armandina Stammer I 11/26/2011, 11:07 PM

## 2011-11-26 NOTE — Progress Notes (Signed)
[facility] Group Notes: (Counselor/Nursing/MHT/Case Management/Adjunct) 11/26/2011   @1 :15pm  Type of Therapy:  Group Therapy  Participation Level:  Active  Participation Quality:  Attentive, Supportive  Affect:  Appropriate  Cognitive:  Appropriate  Insight:  Good  Engagement in Group: Limited  Engagement in Therapy:  Limited  Modes of Intervention:  Support and Exploration  Summary of Progress/Problems: Isaac Jensen was very outspoken in his views about healthy relationships. He shared about problems in communication and manipulations and how they lead to control issues. Isaac Jensen was very supportive to others in the group who discussed their unhealthy relationships and how those relationships affected them.   [name] 11/26/2011 3:41 PM

## 2011-11-26 NOTE — Progress Notes (Signed)
Slept well last nite, appetite is good, energy level is normal and ability to pay attention is good, energy level is normal and ability to pay attention is good, depression is 3/10 and hopeless 1/10, denies Si or HI, continues w/low back pain and requesting medication, attending group and interacting w/pers q3min safety checks continue and support offered Safety maintained

## 2011-11-27 DIAGNOSIS — F331 Major depressive disorder, recurrent, moderate: Secondary | ICD-10-CM | POA: Diagnosis present

## 2011-11-27 MED ORDER — DULOXETINE HCL 60 MG PO CPEP: 60.0000 mg | ORAL_CAPSULE | Freq: Every day | ORAL | Status: DC

## 2011-11-27 NOTE — Discharge Summary (Signed)
Patient ID: Isaac Jensen MRN: [id number] DOB/AGE: 05-21-1966 46 y.o.  Admit date: 11/25/2011 Discharge date: 11/27/2011  Admission Diagnoses: Major depressive disorder   Hospital Course: This is one of several admissions for Isaac Jensen who was on our unit the last time in 2010 after a suicide attempt by cutting his arm. He presents this time complaining of an exacerbation of depression starting in 09-24-2011 after the death of his mother. Since that time his mood has become progressively lower, suffering significant anhedonia, crying spells on a daily basis for the past 2 weeks, and for the past 2 weeks frequently sits in his chair in a darkened closets and cries and thinks about his mother. He feels unable to get past her death. 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 She died in 12-Nov-2012at the age of 75 from COPD.  Isaac Jensen was on Cymbalta 60 mg prior to being admitted here. He reports stopping the medication after he stopped seeing his primary care provider. His Cymbalta was re-initiated. Patient participated in group therapy for few days he was here. He reports improved mood and decreased suicidal ideation. He received medication management for his medical conditions. He is being discharged to his home with all valuables. He will follow-up with Dr. Lolly Mustache on an out patient basis on 12/25/11 and with Dr. Kieth Brightly on 12/05/10. Times and address to [facility] outpatient clinic provided for patient.  Discharge Diagnoses:  Principal Problem:  *Moderate recurrent major depression   Discharged Condition: General Appearance /Behavior: Casual and cooperative.  Eye Contact: Good.  Speech: Appropriate in rate and volume.  Motor Behavior: Appropriate.  Level of Consciousness: Alert and Oriented x 3.  Mental Status: Alert and Oriented x 3.  Mood: Essentially Euthymic.  Affect: Full.  Anxiety Level: None Reported.  Thought  Process: WNL.  Thought Content: The patient denies any auditory or visual hallucinations or delusional thinking today.  Perception:. wnl.  Judgment: Good.  Insight: Good.  Cognition: Orientated to time, place and person.      Discharge Medication List as of 11/27/2011  3:36 PM    START taking these medications   Details  DULoxetine (CYMBALTA) 60 MG capsule Take 1 capsule (60 mg total) by mouth daily., Starting 11/27/2011, Until Wed 11/26/12, Print      CONTINUE these medications which have NOT CHANGED   Details  oxymorphone (OPANA) 10 MG tablet Take 10 mg by mouth every 4 (four) hours as needed.  , Until Discontinued, Historical Med    pregabalin (LYRICA) 300 MG capsule Take 300 mg by mouth 2 (two) times daily., Until Discontinued, Historical Med    tiZANidine (ZANAFLEX) 4 MG tablet Take 4 mg by mouth every 6 (six) hours as needed. , Until Discontinued, Historical Med    ranitidine (ZANTAC) 150 MG capsule Take 1 capsule (150 mg total) by mouth daily., Starting 06/20/2011, Until Thu 06/19/12, Print      STOP taking these medications     oxyCODONE-acetaminophen (PERCOCET) 10-325 MG per tablet        Follow-up Information    Follow up with Dr. Lolly Mustache - [facility] on 12/25/2011. (Your appointment is scheduled for Tuesday, December 25, 2011 at  11:45 a.m.)    Contact information:   [address] [place], [place]  [address]  [phone]      Follow up with Dr. Kieth Brightly - [facility] on 12/06/2011. (You  will see Dr. Shelva Majestic at 2:00 p.m. on Thursday, December 06, 2011)    Contact information:   [address] [place], [place]  [phone]         Signed: Sanjuana Kava 11/27/2011, 3:49 PM

## 2011-11-27 NOTE — Discharge Summary (Signed)
DISCHARGE NOTE: Writer reviewed discharge instructions with patient. Patient verbalized understanding of discharge insructions. Denied SI/HI and denied hallucinations. Discharged with brother in-law.

## 2011-11-27 NOTE — Progress Notes (Signed)
[facility] Group Notes: (Counselor/Nursing/MHT/Case Management/Adjunct) 11/27/2011   @1 :15pm  Type of Therapy:  Group Therapy  Participation Level:  Limited  Participation Quality:  Attentive, Appropriate  Affect:  Blunted  Cognitive:  Appropriate  Insight:  None  Engagement in Group: Limited  Engagement in Therapy:  None  Modes of Intervention:  Support, Education, and Exploration  Summary of Progress/Problems: Isaac Jensen was attentive and nodded along with some statements about dependency and co-dependency, but did not share personally.  Isaac Jensen 11/27/2011 2:16 PM

## 2011-11-27 NOTE — Progress Notes (Signed)
Patient ID: Isaac Jensen, male   DOB: [birth year], 46 y.o.   MRN: [id number] Pt. Lying in bed eyes closed, resp. Even no distress noted. Staff will monitor q55min for safety.

## 2011-11-27 NOTE — Progress Notes (Signed)
[facility] Case Management Discharge Plan:  Will you be returning to the same living situation after discharge: Yes,  Isaac Jensen will return to the home he shares with his wife At discharge, do you have transportation home?:Yes,  Family to transport Do you have the ability to pay for your medications:Yes,  Patient has private insurance  Interagency Information:     Release of information consent forms completed and in the chart;  Patient's signature needed at discharge.  Patient to Follow up at:  Follow-up Information    Follow up with Dr. Lolly Mustache - [facility] on 12/25/2011. (Your appointment is scheduled for Tuesday, December 25, 2011 at  11:45 a.m.)    Contact information:   [address] [place], [place]  [address]  [phone]      Follow up with Dr. Kieth Brightly - [facility] on 12/06/2011. (You will see Dr. Shelva Majestic at 2:00 p.m. on Thursday, December 06, 2011)    Contact information:   [address] [place], [place]  [phone]         Patient denies SI/HI:   Yes,  Isaac Jensen denies SI    Safety Planning and Suicide Prevention discussed:  Yes,  Reviewed during group on 11/26/11  Barrier to discharge identified:No.  Summary and Recommendations:  Patient reports he has accepted mother's death and is now ready to move forward.   Wynn Banker 11/27/2011, 1:35 PM

## 2011-11-27 NOTE — Progress Notes (Signed)
Isaac Jensen Adult Inpatient Family/Significant Other Suicide Prevention Education  Suicide Prevention Education:  Education Completed; Isaac Jensen, wife,  has been identified by the patient as the family member/significant other with whom the patient will be residing, and identified as the person(s) who will aid the patient in the event of a mental health crisis (suicidal ideations/suicide attempt).  With written consent from the patient, the family member/significant other has been provided the following suicide prevention education, prior to the and/or following the discharge of the patient.  The suicide prevention education provided includes the following:  Suicide risk factors  Suicide prevention and interventions  National Suicide Hotline telephone number  Isaac Jensen assessment telephone number  Isaac Jensen Emergency Assistance 911  [place] [facility] and/or Residential Mobile Crisis Unit telephone number  Request made of family/significant other to:  Remove weapons (e.g., guns, rifles, knives), all items previously/currently identified as safety concern.    Remove drugs/medications (over-the-counter, prescriptions, illicit drugs), all items previously/currently identified as a safety concern.  Isaac Jensen reports she has no safety concerns about Isaac Jensen, and that he seems to be back to himself. She states that he has no access to weapons. Isaac Jensen verbalizes understanding of suicide prevention information and has no questions.   Isaac Jensen 11/27/2011, 12:07 PM

## 2011-11-27 NOTE — [facility] Suicide Risk Assessment (Signed)
Suicide Risk Assessment  Discharge Assessment     Demographic factors:  Assessment Details Time of Assessment: Admission Information Obtained From: Patient Current Mental Status:  AO x 3. Risk Reduction Factors:  Risk Reduction Factors: Living with another person, especially a relative  CLINICAL FACTORS:   Depression:   Anhedonia Previous Psychiatric Diagnoses and Treatments  COGNITIVE FEATURES THAT CONTRIBUTE TO RISK:  None Noted.   Diagnosis:  Axis I: Major Depressive Disorder - Recurrent.   The patient was seen today and reports the following:   ADL's: Intact.  Sleep: The patient reports to continuing to sleep well at night.  Appetite: The patient reports an excellent appetite.   Mild>(1-10) >Severe  Hopelessness (1-10): 0  Depression (1-10): 0  Anxiety (1-10): 0   Suicidal Ideation: The patient adamantly denies any suicidal ideations today.  Plan: No  Intent: No  Means: No   Homicidal Ideation: The patient adamantly denies any homicidal ideations today.  Plan: No  Intent: No.  Means: No   General Appearance /Behavior: Casual and cooperative.  Eye Contact: Good.  Speech: Appropriate in rate and volume.  Motor Behavior: Appropriate.  Level of Consciousness: Alert and Oriented x 3.  Mental Status: Alert and Oriented x 3.  Mood: Essentially Euthymic.  Affect: Full.  Anxiety Level: None Reported.  Thought Process: WNL.  Thought Content: The patient denies any auditory or visual hallucinations or delusional thinking today.  Perception:. wnl.  Judgment: Good.  Insight: Good.  Cognition: Orientated to time, place and person.   The patient was seen today by this Physician who is covering for Dr. Dan Humphreys.  The patient reports that he is doing well with the medication Cymbalta and feels ready for discharge.  He states his sister lives nearby and can pick him up today.  Treatment Plan Summary:  1. Daily contact with patient to assess and evaluate symptoms and  progress in treatment  2. Medication management  3. The patient will deny suicidal ideations or homicidal ideations for 48 hours prior to discharge and have a depression and anxiety rating of 3 or less. The patient will also deny any auditory or visual hallucinations or delusional thinking.   Plan:  1. Will continue current medications.  2. Will continue to monitor.  3. Discharge today at the patient's request to outpatient follow up.  SUICIDE RISK:   Minimal: No identifiable suicidal ideation.  Patients presenting with no risk factors but with morbid ruminations; may be classified as minimal risk based on the severity of the depressive symptoms  Isaac Jensen 11/27/2011, 2:36 PM

## 2011-11-27 NOTE — Progress Notes (Signed)
Slept well last nite and appetite is good, energy level is normal, ability to pay attention is good, depressed 1/10 and hopeless 1/10, denies Si or HI, attending group and interacting w/peers in dayroom, taking meds as ordered, still c/o back pain at an 8/10 q62min safety checks continue and support offered, medicated for pain Safety maintained

## 2011-11-27 NOTE — Progress Notes (Signed)
[facility] Group Notes: (Counselor/Nursing/MHT/Case Management/Adjunct) 11/27/2011   @  11:00am   Type of Therapy:  Group Therapy  Participation Level:  Minimal   Participation Quality:  Attentive  Affect:  Blunted  Cognitive:  Appropriate  Insight:  None  Engagement in Group: Minimal  Engagement in Therapy:  None  Modes of Intervention:  Support and Exploration  Summary of Progress/Problems: Isaac Jensen was very attentive and nodded along with the statements of others, but did not personally engage or share in group process.

## 2011-11-29 NOTE — Progress Notes (Signed)
Patient Discharge Instructions:  Admission Note Faxed,  11/28/2011 After Visit Summary Faxed,  11/28/2011 Faxed to the Next Level Care provider:  11/28/2011 D/C Summary faxed 11/28/2011 Facesheet faxed 11/28/2011   Faxed to [facility] - Dr. Lolly Mustache, Dr. Kieth Brightly @ [phone]  Wandra Scot, 11/29/2011, 12:58 PM

## 2011-12-06 ENCOUNTER — Ambulatory Visit (HOSPITAL_COMMUNITY): Payer: Self-pay | Admitting: Psychology

## 2011-12-25 ENCOUNTER — Ambulatory Visit (HOSPITAL_COMMUNITY): Payer: Self-pay | Admitting: Psychiatry

## 2012-04-21 ENCOUNTER — Encounter (HOSPITAL_COMMUNITY): Payer: Self-pay | Admitting: Emergency Medicine

## 2012-04-21 ENCOUNTER — Emergency Department (HOSPITAL_COMMUNITY)
Admission: EM | Admit: 2012-04-21 | Discharge: 2012-04-21 | Disposition: A | Discharge status: Other Acute Inpatient Hospital | Payer: Medicare Other | Attending: Emergency Medicine | Admitting: Emergency Medicine

## 2012-04-21 DIAGNOSIS — F17200 Nicotine dependence, unspecified, uncomplicated: Secondary | ICD-10-CM | POA: Insufficient documentation

## 2012-04-21 DIAGNOSIS — R45851 Suicidal ideations: Secondary | ICD-10-CM | POA: Insufficient documentation

## 2012-04-21 DIAGNOSIS — Z79899 Other long term (current) drug therapy: Secondary | ICD-10-CM | POA: Insufficient documentation

## 2012-04-21 DIAGNOSIS — F331 Major depressive disorder, recurrent, moderate: Secondary | ICD-10-CM

## 2012-04-21 LAB — BASIC METABOLIC PANEL
BUN: 10 mg/dL (ref 6–23)
CO2: 28 mEq/L (ref 19–32)
Calcium: 9.3 mg/dL (ref 8.4–10.5)
GFR calc non Af Amer: >90 mL/min (ref >90)
Glucose, Bld: 90 mg/dL (ref 70–99)
Potassium: 3.9 mEq/L (ref 3.5–5.1)
Sodium: 144 mEq/L (ref 135–145)

## 2012-04-21 LAB — DIFFERENTIAL
Eosinophils Absolute: 0.3 10*3/uL (ref 0.0–0.7)
Eosinophils Relative: 5 % (ref 0–5)
Lymphocytes Relative: 36 % (ref 12–46)
Lymphs Abs: 2 10*3/uL (ref 0.7–4.0)
Monocytes Relative: 10 % (ref 3–12)

## 2012-04-21 LAB — CBC
Hemoglobin: 14.8 g/dL (ref 13.0–17.0)
MCH: 31 pg (ref 26.0–34.0)
MCV: 93.1 fL (ref 78.0–100.0)
Platelets: 218 10*3/uL (ref 150–400)
RBC: 4.78 MIL/uL (ref 4.22–5.81)
WBC: 5.7 10*3/uL (ref 4.0–10.5)

## 2012-04-21 LAB — ETHANOL: Alcohol, Ethyl (B): <11 mg/dL (ref 0–11)

## 2012-04-21 LAB — RAPID URINE DRUG SCREEN, HOSP PERFORMED: Opiates: NOT DETECTED

## 2012-04-21 MED ORDER — TIZANIDINE HCL 4 MG PO TABS
4.0000 mg | ORAL_TABLET | Freq: Four times a day (QID) | ORAL | Status: DC
  Administered 2012-04-21: 4 mg via ORAL
  Filled 2012-04-21 (×5): qty 3

## 2012-04-21 MED ORDER — FAMOTIDINE 20 MG PO TABS
20.0000 mg | ORAL_TABLET | Freq: Two times a day (BID) | ORAL | Status: DC
  Administered 2012-04-21: 20 mg via ORAL
  Filled 2012-04-21: qty 1

## 2012-04-21 MED ORDER — ACETAMINOPHEN 325 MG PO TABS
650.0000 mg | ORAL_TABLET | Freq: Four times a day (QID) | ORAL | Status: DC | PRN
Start: 2012-04-21 — End: 2012-04-21

## 2012-04-21 MED ORDER — LORAZEPAM 1 MG PO TABS: 1.0000 mg | ORAL_TABLET | Freq: Three times a day (TID) | ORAL | Status: DC | PRN

## 2012-04-21 MED ORDER — ASPIRIN-ACETAMINOPHEN-CAFFEINE 500-325-65 MG PO PACK: 1.0000 | PACK | ORAL | Status: DC | PRN

## 2012-04-21 MED ORDER — PREGABALIN 75 MG PO CAPS
300.0000 mg | ORAL_CAPSULE | Freq: Two times a day (BID) | ORAL | Status: DC
  Administered 2012-04-21: 300 mg via ORAL
  Filled 2012-04-21: qty 3
  Filled 2012-04-21: qty 1

## 2012-04-21 MED ORDER — NICOTINE 21 MG/24HR TD PT24
21.0000 mg | MEDICATED_PATCH | Freq: Every day | TRANSDERMAL | Status: DC
  Administered 2012-04-21: 21 mg via TRANSDERMAL
  Filled 2012-04-21: qty 1

## 2012-04-21 MED ORDER — MORPHINE SULFATE ER 30 MG PO TBCR
60.0000 mg | EXTENDED_RELEASE_TABLET | Freq: Two times a day (BID) | ORAL | Status: DC
  Administered 2012-04-21: 60 mg via ORAL
  Filled 2012-04-21: qty 2

## 2012-04-21 MED ORDER — OXYCODONE-ACETAMINOPHEN 5-325 MG PO TABS: 1.0000 | ORAL_TABLET | Freq: Two times a day (BID) | ORAL | Status: DC

## 2012-04-21 MED ORDER — ZOLPIDEM TARTRATE 5 MG PO TABS: 5.0000 mg | ORAL_TABLET | Freq: Every evening | ORAL | Status: DC | PRN

## 2012-04-21 MED ORDER — DULOXETINE HCL 60 MG PO CPEP
60.0000 mg | ORAL_CAPSULE | Freq: Every day | ORAL | Status: DC
  Administered 2012-04-21: 60 mg via ORAL
  Filled 2012-04-21 (×2): qty 1

## 2012-04-21 MED ORDER — ONDANSETRON HCL 4 MG PO TABS: 4.0000 mg | ORAL_TABLET | Freq: Three times a day (TID) | ORAL | Status: DC | PRN

## 2012-04-21 MED ORDER — OXYCODONE HCL 5 MG PO TABS
5.0000 mg | ORAL_TABLET | Freq: Two times a day (BID) | ORAL | Status: DC
  Administered 2012-04-21: 5 mg via ORAL
  Filled 2012-04-21: qty 1

## 2012-04-21 MED ORDER — OXYCODONE-ACETAMINOPHEN 10-325 MG PO TABS: 1.0000 | ORAL_TABLET | Freq: Two times a day (BID) | ORAL | Status: DC
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
## 2012-04-21 NOTE — ED Notes (Signed)
Spoke with [name] from [facility]. He stated that pt was accepted and had a bed. Accepting MD there is Dr.Kohl. ER Dr made aware. Report can be given to [facility] at [phone].
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
## 2012-04-21 NOTE — ED Notes (Signed)
Pt reporting multiple family problems, and states "I just need to talk to someone before I do something stupid."  Pt reports having suicide plan in place. Reports that he did have a knife and cut clothes earlier tonight, but did not harm self.   No actual plan for harming others.  Denies ETOH or drug use tonight.

## 2012-04-21 NOTE — Clinical Social Work Psych Note (Signed)
The patient has been accepted to [facility] by Dr. Wendall Stade. He is IVC and will be transported by [facility]. Support paper work completed

## 2012-05-14 ENCOUNTER — Other Ambulatory Visit: Payer: Self-pay

## 2012-05-14 ENCOUNTER — Encounter (HOSPITAL_COMMUNITY): Payer: Self-pay | Admitting: *Deleted

## 2012-05-14 ENCOUNTER — Emergency Department (HOSPITAL_COMMUNITY)
Admission: EM | Admit: 2012-05-14 | Discharge: 2012-05-14 | Disposition: A | Discharge status: Home / Self Care | Payer: Medicare Other | Attending: Emergency Medicine | Admitting: Emergency Medicine

## 2012-05-14 DIAGNOSIS — R569 Unspecified convulsions: Secondary | ICD-10-CM

## 2012-05-14 DIAGNOSIS — F17200 Nicotine dependence, unspecified, uncomplicated: Secondary | ICD-10-CM | POA: Insufficient documentation

## 2012-05-14 DIAGNOSIS — G8929 Other chronic pain: Secondary | ICD-10-CM | POA: Insufficient documentation

## 2012-05-14 DIAGNOSIS — G40909 Epilepsy, unspecified, not intractable, without status epilepticus: Secondary | ICD-10-CM

## 2012-05-14 DIAGNOSIS — F329 Major depressive disorder, single episode, unspecified: Secondary | ICD-10-CM | POA: Insufficient documentation

## 2012-05-14 DIAGNOSIS — F3289 Other specified depressive episodes: Secondary | ICD-10-CM | POA: Insufficient documentation

## 2012-05-14 NOTE — ED Notes (Signed)
Pt reports having a seizure about 1 hour ago. Pt denies any injury, states seizures started after having pacemaker placement. Pt family encouraged pt to come to the er.

## 2012-05-14 NOTE — ED Provider Notes (Addendum)
History   This chart was scribed for Shelda Jakes, MD by Melba Coon. The patient was seen in room APA04/APA04 and the patient's care was started at 10:27PM.    CSN: [id number]  Arrival date & time 05/14/12  2143   First MD Initiated Contact with Patient 05/14/12 2215      Chief Complaint  Patient presents with  . Seizures    (Consider location/radiation/quality/duration/timing/severity/associated sxs/prior treatment) HPI Isaac Jensen is a 46 y.o. male who EMS presents to the Emergency Department complaining of a witnessed seizure with an onset tonight. Hx of seizures and syncope. Seizure tonight was witnessed by son of pt; pt's son states that pt stated he wasn't feeling well, then he passed out; son caught pt from falling into the ground, then pt went into his seizure for 2 minutes. EMS came and got pt who was still groggy but responsive. EMS reports CBG 91 en route to the ED. Pt walked into the ED today. No HA, fever, neck pain, sore throat, rash, back pain, CP, SOB, abd pain, n/v/d, dysuria, or extremity pain, edema, weakness, numbness, or tingling. No known allergies. No other pertinent medical symptoms.  PCP: Dr. Roderic Ovens in [place]  Past Medical History  Diagnosis Date  . Seizures   . Chronic pain   . Pacemaker   . Syncope   . Syncope   . Depression     Past Surgical History  Procedure Date  . Back surgery   . Neck surgery   . Knee arthroscopy   . Insert / replace / remove pacemaker   . Shoulder surgery     Family History  Problem Relation Age of Onset  . Hypertension Brother   . Heart attack Brother     History  Substance Use Topics  . Smoking status: Current Everyday Smoker -- 3.0 packs/day for 30 years    Types: Cigarettes  . Smokeless tobacco: Never Used  . Alcohol Use: No     pt reports a couple of sips of champagne on New Years      Review of Systems 10 Systems reviewed and all are negative for acute change except as noted in the  HPI.   Allergies  Ketorolac tromethamine; Sulfa drugs cross reactors; Tramadol; Penicillins; and Trazodone and nefazodone  Home Medications   Current Outpatient Rx  Name Route Sig Dispense Refill  . ASPIRIN-ACETAMINOPHEN-CAFFEINE 500-325-65 MG PO PACK Oral Take 1 Package by mouth as needed. For pain    . OXYCODONE-ACETAMINOPHEN 10-325 MG PO TABS Oral Take 1 tablet by mouth 2 (two) times daily.    Marland Kitchen OXYMORPHONE HCL ER 20 MG PO TB12 Oral Take 20 mg by mouth every 12 (twelve) hours.    Marland Kitchen PREGABALIN 300 MG PO CAPS Oral Take 300 mg by mouth 2 (two) times daily.    Marland Kitchen TIZANIDINE HCL 4 MG PO TABS Oral Take 4-12 mg by mouth 4 (four) times daily. 4mg  three times day and 12 mg at bedtime      Ht 5\' 10"  (1.778 m)  Wt 170 lb (77.111 kg)  BMI 24.39 kg/m2  Physical Exam  Nursing note and vitals reviewed. Constitutional: He is oriented to person, place, and time. He appears well-developed and well-nourished.  HENT:  Head: Normocephalic and atraumatic.  Mouth/Throat: Oropharynx is clear and moist.  Eyes: Conjunctivae normal and EOM are normal. Pupils are equal, round, and reactive to light.  Neck: Normal range of motion. Neck supple.  Cardiovascular: Normal rate, regular rhythm and normal  heart sounds.   No murmur heard. Pulmonary/Chest: Effort normal and breath sounds normal.  Abdominal: Soft. Bowel sounds are normal.  Musculoskeletal: Normal range of motion.  Neurological: He is alert and oriented to person, place, and time. No cranial nerve deficit. He exhibits normal muscle tone. Coordination normal.  Skin: Skin is warm. No rash noted.    ED Course  Procedures (including critical care time)   COORDINATION OF CARE:  10:32PM - EDMD will d/c pt.  Date: 05/14/2012  Rate: 86  Rhythm: normal sinus rhythm  QRS Axis: left  Intervals: normal  ST/T Wave abnormalities: nonspecific ST/T changes  Conduction Disutrbances:none  Narrative Interpretation:   Old EKG Reviewed: none  available Correction to above axis is normal. Also correction to above no EKG changes compared to 06/20/2011   Labs Reviewed - No data to display No results found.   1. Seizure   2. Seizure disorder       MDM  Patient with known seizure disorder symptoms tonight were similar to a generalized seizures currently followed by neurology will give referral. Patient had generalized tonic-clonic movement for less than 5 minutes and a brief postictal phase, symptoms could be related to pseudoseizures patient had pacemaker placed for the syncope that the syncope causes seizures but since the pacemaker is placed he's had for seizures since then. Blood sugar was fine EKG without specific abnormalities patient currently nontoxic no acute distress no injuries from the seizure.  I personally performed the services described in this documentation, which was scribed in my presence. The recorded information has been reviewed and considered.           Shelda Jakes, MD 05/14/12 8295  Shelda Jakes, MD 07/30/12 2055

## 2012-05-14 NOTE — ED Notes (Signed)
Reported by ems pt had a seizure, pt encourage to come to er by family. Pt walked into the er w/ ems.

## 2012-05-14 NOTE — ED Notes (Signed)
Pt alert & oriented x4, stable gait. Pt given discharge instructions, paperwork. Patient instructed to stop at the registration desk to finish any additional paperwork. pt verbalized understanding. Pt left department w/ no further questions.  

## 2012-05-14 NOTE — ED Notes (Signed)
Pt wanting to leave. Spoke w/ pt & has agreed to stay until 2300. EDP notified of pt wanting to leave.

## 2012-08-10 ENCOUNTER — Emergency Department (HOSPITAL_COMMUNITY)
Admission: EM | Admit: 2012-08-10 | Discharge: 2012-08-11 | Disposition: A | Discharge status: Other Acute Inpatient Hospital | Payer: Medicare Other | Attending: Emergency Medicine | Admitting: Emergency Medicine

## 2012-08-10 ENCOUNTER — Encounter (HOSPITAL_COMMUNITY): Payer: Self-pay | Admitting: *Deleted

## 2012-08-10 DIAGNOSIS — Z046 Encounter for general psychiatric examination, requested by authority: Secondary | ICD-10-CM | POA: Insufficient documentation

## 2012-08-10 DIAGNOSIS — E876 Hypokalemia: Secondary | ICD-10-CM

## 2012-08-10 DIAGNOSIS — R45851 Suicidal ideations: Secondary | ICD-10-CM | POA: Insufficient documentation

## 2012-08-10 LAB — CBC WITH DIFFERENTIAL/PLATELET
Eosinophils Absolute: 0 10*3/uL (ref 0.0–0.7)
Eosinophils Relative: 0 % (ref 0–5)
Hemoglobin: 13.1 g/dL (ref 13.0–17.0)
Lymphs Abs: 2.3 10*3/uL (ref 0.7–4.0)
MCH: 30 pg (ref 26.0–34.0)
MCV: 89.9 fL (ref 78.0–100.0)
Monocytes Relative: 5 % (ref 3–12)
RBC: 4.36 MIL/uL (ref 4.22–5.81)

## 2012-08-10 LAB — BASIC METABOLIC PANEL
BUN: 27 mg/dL — ABNORMAL HIGH (ref 6–23)
Calcium: 8.9 mg/dL (ref 8.4–10.5)
Chloride: 104 mEq/L (ref 96–112)
Creatinine, Ser: 1.12 mg/dL (ref 0.50–1.35)
Creatinine, Ser: 0.97 mg/dL (ref 0.50–1.35)
GFR calc Af Amer: >90 mL/min (ref >90)
GFR calc non Af Amer: 76 mL/min — ABNORMAL LOW (ref >90)
Glucose, Bld: 110 mg/dL — ABNORMAL HIGH (ref 70–99)
Potassium: 3.3 mEq/L — ABNORMAL LOW (ref 3.5–5.1)

## 2012-08-10 LAB — RAPID URINE DRUG SCREEN, HOSP PERFORMED
Barbiturates: NOT DETECTED
Cocaine: NOT DETECTED
Tetrahydrocannabinol: POSITIVE — AB

## 2012-08-10 MED ORDER — TIZANIDINE HCL 4 MG PO TABS
ORAL_TABLET | ORAL | Status: AC
  Filled 2012-08-10: qty 3

## 2012-08-10 MED ORDER — OXYCODONE-ACETAMINOPHEN 5-325 MG PO TABS
2.0000 | ORAL_TABLET | Freq: Once | ORAL | Status: AC
  Administered 2012-08-10: 2 via ORAL
  Filled 2012-08-10: qty 2

## 2012-08-10 MED ORDER — PREGABALIN 50 MG PO CAPS
ORAL_CAPSULE | ORAL | Status: AC
  Filled 2012-08-10: qty 1

## 2012-08-10 MED ORDER — TIZANIDINE HCL 4 MG PO TABS
4.0000 mg | ORAL_TABLET | Freq: Three times a day (TID) | ORAL | Status: DC
  Administered 2012-08-10 – 2012-08-11 (×2): 4 mg via ORAL
  Filled 2012-08-10 (×3): qty 1

## 2012-08-10 MED ORDER — LORAZEPAM 1 MG PO TABS
1.0000 mg | ORAL_TABLET | Freq: Three times a day (TID) | ORAL | Status: DC | PRN
  Administered 2012-08-10 (×2): 1 mg via ORAL
  Filled 2012-08-10 (×2): qty 1

## 2012-08-10 MED ORDER — POTASSIUM CHLORIDE CRYS ER 20 MEQ PO TBCR: 40.0000 meq | EXTENDED_RELEASE_TABLET | Freq: Once | ORAL | Status: DC

## 2012-08-10 MED ORDER — NICOTINE 21 MG/24HR TD PT24
21.0000 mg | MEDICATED_PATCH | Freq: Every day | TRANSDERMAL | Status: DC
  Administered 2012-08-10: 21 mg via TRANSDERMAL
  Filled 2012-08-10: qty 1

## 2012-08-10 MED ORDER — HYDROMORPHONE HCL PF 1 MG/ML IJ SOLN
1.0000 mg | Freq: Once | INTRAMUSCULAR | Status: AC
  Administered 2012-08-10: 1 mg via INTRAMUSCULAR
  Filled 2012-08-10: qty 1

## 2012-08-10 MED ORDER — OXYCODONE-ACETAMINOPHEN 5-325 MG PO TABS
2.0000 | ORAL_TABLET | Freq: Four times a day (QID) | ORAL | Status: DC | PRN
  Administered 2012-08-10 – 2012-08-11 (×3): 2 via ORAL
  Filled 2012-08-10 (×3): qty 2

## 2012-08-10 MED ORDER — TIZANIDINE HCL 4 MG PO TABS
12.0000 mg | ORAL_TABLET | Freq: Every day | ORAL | Status: DC
  Administered 2012-08-10: 12 mg via ORAL
  Filled 2012-08-10: qty 3

## 2012-08-10 MED ORDER — PREGABALIN 50 MG PO CAPS
ORAL_CAPSULE | ORAL | Status: AC
  Filled 2012-08-10: qty 2

## 2012-08-10 MED ORDER — ONDANSETRON HCL 4 MG PO TABS
4.0000 mg | ORAL_TABLET | Freq: Three times a day (TID) | ORAL | Status: DC | PRN
  Administered 2012-08-10: 4 mg via ORAL
  Filled 2012-08-10: qty 1

## 2012-08-10 MED ORDER — POTASSIUM CHLORIDE CRYS ER 20 MEQ PO TBCR
20.0000 meq | EXTENDED_RELEASE_TABLET | Freq: Once | ORAL | Status: AC
  Administered 2012-08-10: 20 meq via ORAL
  Filled 2012-08-10: qty 1

## 2012-08-10 MED ORDER — OXYCODONE-ACETAMINOPHEN 5-325 MG PO TABS
1.0000 | ORAL_TABLET | Freq: Four times a day (QID) | ORAL | Status: DC | PRN
  Administered 2012-08-10: 1 via ORAL
  Filled 2012-08-10: qty 1

## 2012-08-10 MED ORDER — PREGABALIN 75 MG PO CAPS
300.0000 mg | ORAL_CAPSULE | Freq: Two times a day (BID) | ORAL | Status: DC
  Administered 2012-08-10 – 2012-08-11 (×2): 300 mg via ORAL
  Filled 2012-08-10: qty 2
  Filled 2012-08-10: qty 4

## 2012-08-10 MED ORDER — TIZANIDINE HCL 4 MG PO TABS
4.0000 mg | ORAL_TABLET | Freq: Three times a day (TID) | ORAL | Status: DC
  Administered 2012-08-10: 4 mg via ORAL

## 2012-08-10 MED ORDER — POTASSIUM CHLORIDE CRYS ER 20 MEQ PO TBCR
40.0000 meq | EXTENDED_RELEASE_TABLET | Freq: Once | ORAL | Status: AC
  Administered 2012-08-10: 40 meq via ORAL
  Filled 2012-08-10: qty 2

## 2012-08-10 NOTE — ED Provider Notes (Addendum)
History     CSN: [id number]  Arrival date & time 08/10/12  0017   First MD Initiated Contact with Patient 08/10/12 0031      Chief Complaint  Patient presents with  . Suicidal     Patient is a 46 y.o. male presenting with mental health disorder. The history is provided by the patient.  Mental Health Problem The primary symptoms include dysphoric mood. Episode onset: an unknown time ago. This is a recurrent problem.  The degree of incapacity that he is experiencing as a consequence of his illness is moderate. Additional symptoms of the illness do not include no abdominal pain. He admits to suicidal ideas. He does have a plan to commit suicide. Risk factors that are present for mental illness include a history of mental illness.  his course is worsening Pt reports he feels that he may harm himself - he reports that he has a plan to stab himself He denies attempting overdose.  He has not harmed himself as of yet  He denies current cp/sob/headache/weakness Apparently while walking to the ED he had brief syncopal episode (no seizure) but did not injure himself.  He reports he has syncope several times/week and this is not new for him.    Past Medical History  Diagnosis Date  . Seizures   . Chronic pain   . Pacemaker   . Syncope   . Syncope   . Depression     Past Surgical History  Procedure Date  . Back surgery   . Neck surgery   . Knee arthroscopy   . Insert / replace / remove pacemaker   . Shoulder surgery     Family History  Problem Relation Age of Onset  . Hypertension Brother   . Heart attack Brother     History  Substance Use Topics  . Smoking status: Current Every Day Smoker -- 1.0 packs/day for 30 years    Types: Cigarettes  . Smokeless tobacco: Never Used  . Alcohol Use: No     pt reports a couple of sips of champagne on New Years      Review of Systems  Constitutional: Negative for fever.  Respiratory: Negative for shortness of breath.     Cardiovascular: Negative for chest pain.  Gastrointestinal: Negative for abdominal pain.  Psychiatric/Behavioral: Positive for dysphoric mood.  All other systems reviewed and are negative.    Allergies  Ketorolac tromethamine; Sulfa drugs cross reactors; Tramadol; Penicillins; and Trazodone and nefazodone  Home Medications   Current Outpatient Rx  Name Route Sig Dispense Refill  . ASPIRIN-ACETAMINOPHEN-CAFFEINE 500-325-65 MG PO PACK Oral Take 1 Package by mouth as needed. For pain    . OXYCODONE-ACETAMINOPHEN 10-325 MG PO TABS Oral Take 1 tablet by mouth 2 (two) times daily.    Marland Kitchen OXYMORPHONE HCL ER 20 MG PO TB12 Oral Take 20 mg by mouth every 12 (twelve) hours.    Marland Kitchen PREGABALIN 300 MG PO CAPS Oral Take 300 mg by mouth 2 (two) times daily.    Marland Kitchen TIZANIDINE HCL 4 MG PO TABS Oral Take 4-12 mg by mouth 4 (four) times daily. 4mg  three times day and 12 mg at bedtime       Physical Exam CONSTITUTIONAL: Well developed/well nourished HEAD AND FACE: Normocephalic/atraumatic EYES: EOMI/PERRL ENMT: Mucous membranes moist NECK: supple no meningeal signs SPINE:entire spine nontender CV: S1/S2 noted, no murmurs/rubs/gallops noted Chest - pacemaker noted in right upper chest LUNGS: Lungs are clear to auscultation bilaterally, no apparent distress ABDOMEN:  soft, nontender, no rebound or guarding GU:no cva tenderness NEURO: Pt is awake/alert, moves all extremitiesx4 EXTREMITIES: pulses normal, full ROM SKIN: warm, color normal PSYCH: no abnormalities of mood noted  ED Course  Procedures   Labs Reviewed  CBC WITH DIFFERENTIAL  BASIC METABOLIC PANEL  ETHANOL  URINE RAPID DRUG SCREEN (HOSP PERFORMED)   1:22 AM Pt will require psych evaluation He reported syncope, but apparently this happens frequently despite his pacemaker placement previously.  Will defer further workup He is awake/alert, no distress at this time   MDM  Nursing notes including past medical history and social  history reviewed and considered in documentation Labs/vital reviewed and considered Previous records reviewed and considered - previous evaluations for depression previously       Date: 08/10/2012  Rate: 64  Rhythm: atrial paced  QRS Axis: normal  Intervals: normal  ST/T Wave abnormalities: nonspecific ST changes  Conduction Disutrbances:none     Joya Gaskins, MD 08/10/12 0122  Joya Gaskins, MD 08/10/12 [phone]

## 2012-08-10 NOTE — ED Notes (Signed)
Samson Frederic from Act team was called. Samson Frederic is aware that pt is in ED needing psychiatric evaluation. Samson Frederic spoke with Dr.Strand last night. Samson Frederic is waiting on disposition of pt's at [facility]. Samson Frederic states there are no behavioral beds at this time, and she will be here later to evaluate pt.
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
## 2012-08-10 NOTE — ED Notes (Signed)
Patient concerned that meds were late in being ordered today.  He is also in a lot of pain in his back that the Perocet has not helped.  A hospital bed has been brought to his room.  Dr. Estell Harpin asked for an injection for pain to get him over the pain he is in now so the Perocet will keep him at an even keel.

## 2012-08-10 NOTE — ED Notes (Signed)
Pt asking for pain meds. Pt has become verbally agitated.

## 2012-08-10 NOTE — ED Notes (Signed)
[employer] talking with pt. Pt agreed to taking the 2 percocet tabs ordered.

## 2012-08-10 NOTE — ED Provider Notes (Signed)
Filed Vitals:   08/10/12 0752  BP: 111/69  Pulse: 71  Temp:   Resp: 18   Pt awaiting placement. IVC'd because of SI. Pt with chronic pain issues. PRN percocet ordered.  Raeford Razor, MD 08/10/12 [phone]

## 2012-08-10 NOTE — Progress Notes (Signed)
Spoke with Beth at [facility] who reported no beds but pt will be reviewed for their wait list for tomorrow.

## 2012-08-10 NOTE — ED Notes (Signed)
Patient said all of this didn't matter as he has a 45 that he will be using when he gets out of this.

## 2012-08-10 NOTE — ED Notes (Signed)
Pt requesting his lyrica and zanaflex. edp aware. Pt cooperative. Ella from ACT just evaluated.

## 2012-08-10 NOTE — ED Notes (Signed)
Pt now IVC due to threatening to leave.

## 2012-08-10 NOTE — ED Notes (Signed)
Reports pain is improved.

## 2012-08-10 NOTE — ED Notes (Signed)
[facility]ake from [facility] called and stated that pt had been accepted by Dr.Carlton. But pt can not come until after 7 am on Monday morning. Intake stated that [name] would call around 5:30am with bed assignment. [name], RN and [place] from Act team made aware.

## 2012-08-10 NOTE — ED Notes (Signed)
Patient is trying to rest , but he is very tearful at this time.States that he is in a lot of pain. Let his nurse Kristi RN know about his status.

## 2012-08-10 NOTE — ED Notes (Signed)
Patient was asking for a drink. Gave him a coke at this time.

## 2012-08-10 NOTE — Progress Notes (Signed)
Spoke with pt regarding bed situation and pt is threatening to leave the ed if he has to stay overnight.  Pt is now under involuntary commitment.  Papers [facility].

## 2012-08-10 NOTE — ED Notes (Signed)
Pt c/o thoughts of harming himself. Pt states he has felt like this for a week. Pt states he had planned to take a knife and stab himself in the heart.

## 2012-08-10 NOTE — ED Notes (Signed)
Pt states took nicotine patch off. When asked is wants another one pt states " I dont care"

## 2012-08-10 NOTE — ED Notes (Addendum)
MD notified of pt request for lyrica and zanaflex. Pt getting more anxious and stated,"if I have to stay here another night I will walk out that door. I am not scared of security or the law because I am not stupid enough to go home." Pt was asked if there was anything we could do to make him more comfortable while he is awaiting placement at [facility]. Pt responded by saying you can't give me what I need. Pt was asked what he needed and responded by saying," I want my 45." EDP and ACT Team notified.

## 2012-08-10 NOTE — BHH Counselor (Signed)
Hattie Perch, assessment counselor at [facility], submitted Pt for admission to [facility]. Shalita Forrest, [facility] confirmed there is no bed currently available. Caroleen Hamman, FNP reviewed clinical information and recommended [facility] psychiatrist review Pt's clinical information in the morning due to syncopal episodes and cardiac issues.  Harlin Rain Patsy Baltimore, LPC

## 2012-08-10 NOTE — ED Notes (Signed)
Pt c/o pain. Rating 8 to lower back. edp aware.

## 2012-08-10 NOTE — ED Notes (Signed)
Pt calm/cooperative now. Given phone to call family

## 2012-08-10 NOTE — ED Notes (Signed)
Intake at [facility] called

## 2012-08-11 MED ORDER — NICOTINE 21 MG/24HR TD PT24
MEDICATED_PATCH | TRANSDERMAL | Status: AC
  Administered 2012-08-11: 21 mg
  Filled 2012-08-11: qty 1

## 2012-08-11 MED ORDER — ZOLPIDEM TARTRATE 5 MG PO TABS
10.0000 mg | ORAL_TABLET | Freq: Once | ORAL | Status: AC
  Administered 2012-08-11: 10 mg via ORAL
  Filled 2012-08-11: qty 2

## 2012-08-11 NOTE — ED Notes (Signed)
Requests sleeping medication, MD notified.

## 2012-08-11 NOTE — ED Notes (Signed)
Initially refuses Ambien, states it will not work for him, then states he will try it and see if it will make him sleep.

## 2012-08-11 NOTE — ED Notes (Signed)
[facility] called for transport to [place]

## 2012-08-11 NOTE — ED Provider Notes (Signed)
Patient has been accepted at [facility] by Dr. Betti Cruz.  Dione Booze, MD 08/11/12 [phone]
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
## 2012-08-11 NOTE — ED Notes (Signed)
Patient was resting. He is up,stated that he is agitated that he can not get any rest because staff and other patients are entirely too loud .

## 2012-09-09 ENCOUNTER — Emergency Department (HOSPITAL_COMMUNITY): Payer: Medicare Other

## 2012-09-09 ENCOUNTER — Encounter (HOSPITAL_COMMUNITY): Payer: Self-pay | Admitting: *Deleted

## 2012-09-09 ENCOUNTER — Observation Stay (HOSPITAL_COMMUNITY)
Admission: EM | Admit: 2012-09-09 | Discharge: 2012-09-10 | Disposition: A | Discharge status: Home / Self Care | Payer: Medicare Other | Attending: Internal Medicine | Admitting: Internal Medicine

## 2012-09-09 DIAGNOSIS — G40909 Epilepsy, unspecified, not intractable, without status epilepticus: Secondary | ICD-10-CM | POA: Diagnosis present

## 2012-09-09 DIAGNOSIS — L989 Disorder of the skin and subcutaneous tissue, unspecified: Secondary | ICD-10-CM | POA: Insufficient documentation

## 2012-09-09 DIAGNOSIS — M5030 Other cervical disc degeneration, unspecified cervical region: Secondary | ICD-10-CM | POA: Insufficient documentation

## 2012-09-09 DIAGNOSIS — I498 Other specified cardiac arrhythmias: Secondary | ICD-10-CM | POA: Insufficient documentation

## 2012-09-09 DIAGNOSIS — E876 Hypokalemia: Secondary | ICD-10-CM | POA: Diagnosis present

## 2012-09-09 DIAGNOSIS — Z720 Tobacco use: Secondary | ICD-10-CM

## 2012-09-09 DIAGNOSIS — R001 Bradycardia, unspecified: Secondary | ICD-10-CM

## 2012-09-09 DIAGNOSIS — M1990 Unspecified osteoarthritis, unspecified site: Secondary | ICD-10-CM | POA: Diagnosis present

## 2012-09-09 DIAGNOSIS — Z8679 Personal history of other diseases of the circulatory system: Secondary | ICD-10-CM

## 2012-09-09 DIAGNOSIS — M5090 Cervical disc disorder, unspecified, unspecified cervical region: Secondary | ICD-10-CM | POA: Diagnosis present

## 2012-09-09 DIAGNOSIS — F17200 Nicotine dependence, unspecified, uncomplicated: Secondary | ICD-10-CM | POA: Insufficient documentation

## 2012-09-09 DIAGNOSIS — F331 Major depressive disorder, recurrent, moderate: Secondary | ICD-10-CM | POA: Diagnosis present

## 2012-09-09 DIAGNOSIS — R55 Syncope and collapse: Secondary | ICD-10-CM | POA: Diagnosis present

## 2012-09-09 DIAGNOSIS — G8929 Other chronic pain: Secondary | ICD-10-CM | POA: Diagnosis present

## 2012-09-09 DIAGNOSIS — M7989 Other specified soft tissue disorders: Secondary | ICD-10-CM

## 2012-09-09 DIAGNOSIS — I951 Orthostatic hypotension: Principal | ICD-10-CM | POA: Diagnosis present

## 2012-09-09 DIAGNOSIS — R7989 Other specified abnormal findings of blood chemistry: Secondary | ICD-10-CM | POA: Diagnosis present

## 2012-09-09 DIAGNOSIS — L0291 Cutaneous abscess, unspecified: Secondary | ICD-10-CM

## 2012-09-09 DIAGNOSIS — R6889 Other general symptoms and signs: Secondary | ICD-10-CM | POA: Insufficient documentation

## 2012-09-09 LAB — CBC WITH DIFFERENTIAL/PLATELET
Basophils Absolute: 0 10*3/uL (ref 0.0–0.1)
Basophils Relative: 0 % (ref 0–1)
Eosinophils Absolute: 0.1 10*3/uL (ref 0.0–0.7)
Eosinophils Relative: 1 % (ref 0–5)
MCH: 30.3 pg (ref 26.0–34.0)
MCV: 89.3 fL (ref 78.0–100.0)
Platelets: 351 10*3/uL (ref 150–400)
RDW: 14.5 % (ref 11.5–15.5)

## 2012-09-09 LAB — BASIC METABOLIC PANEL
CO2: 22 mEq/L (ref 19–32)
Calcium: 9.1 mg/dL (ref 8.4–10.5)
Creatinine, Ser: 0.7 mg/dL (ref 0.50–1.35)
Glucose, Bld: 99 mg/dL (ref 70–99)

## 2012-09-09 LAB — URINALYSIS, ROUTINE W REFLEX MICROSCOPIC
Glucose, UA: NEGATIVE mg/dL
Leukocytes, UA: NEGATIVE
Protein, ur: NEGATIVE mg/dL
pH: 6 (ref 5.0–8.0)

## 2012-09-09 LAB — GLUCOSE, CAPILLARY: Glucose-Capillary: 112 mg/dL — ABNORMAL HIGH (ref 70–99)

## 2012-09-09 LAB — RAPID URINE DRUG SCREEN, HOSP PERFORMED: Opiates: NOT DETECTED

## 2012-09-09 MED ORDER — POTASSIUM CHLORIDE 10 MEQ/100ML IV SOLN
10.0000 meq | Freq: Once | INTRAVENOUS | Status: AC
  Administered 2012-09-09: 10 meq via INTRAVENOUS
  Filled 2012-09-09: qty 100

## 2012-09-09 MED ORDER — SODIUM CHLORIDE 0.9 % IV SOLN
1000.0000 mL | Freq: Once | INTRAVENOUS | Status: AC
  Administered 2012-09-09: 1000 mL via INTRAVENOUS

## 2012-09-09 MED ORDER — SODIUM CHLORIDE 0.9 % IV SOLN
1000.0000 mL | INTRAVENOUS | Status: DC
  Administered 2012-09-09: 1000 mL via INTRAVENOUS

## 2012-09-09 MED ORDER — POTASSIUM CHLORIDE 20 MEQ/15ML (10%) PO LIQD
40.0000 meq | Freq: Once | ORAL | Status: AC
  Administered 2012-09-09: 40 meq via ORAL
  Filled 2012-09-09: qty 30

## 2012-09-09 NOTE — ED Notes (Signed)
Pt asked for a soda. Coke given.

## 2012-09-09 NOTE — ED Provider Notes (Addendum)
History     CSN: [id number]  Arrival date & time 09/09/12  1734   First MD Initiated Contact with Patient 09/09/12 1932      Chief Complaint  Patient presents with  . Abscess    (Consider location/radiation/quality/duration/timing/severity/associated sxs/prior treatment) Patient is a 46 y.o. male presenting with abscess.  Abscess  This is a new problem. The current episode started yesterday. The problem occurs frequently. The problem has been gradually worsening. The abscess is present on the right arm. The problem is moderate. The abscess is characterized by painfulness. It is unknown what he was exposed to. The abscess first occurred at home. Associated symptoms include decreased sleep. Pertinent negatives include no anorexia, no decrease in physical activity, no fever and no cough. There were no sick contacts.    Past Medical History  Diagnosis Date  . Seizures   . Chronic pain   . Pacemaker   . Syncope   . Syncope   . Depression     Past Surgical History  Procedure Date  . Back surgery   . Neck surgery   . Knee arthroscopy   . Insert / replace / remove pacemaker   . Shoulder surgery     Family History  Problem Relation Age of Onset  . Hypertension Brother   . Heart attack Brother     History  Substance Use Topics  . Smoking status: Current Every Day Smoker -- 1.0 packs/day for 30 years    Types: Cigarettes  . Smokeless tobacco: Never Used  . Alcohol Use: No     pt reports a couple of sips of champagne on New Years      Review of Systems  Constitutional: Negative for fever and activity change.       All ROS Neg except as noted in HPI  HENT: Negative for nosebleeds and neck pain.   Eyes: Negative for photophobia and discharge.  Respiratory: Negative for cough, shortness of breath and wheezing.   Cardiovascular: Negative for chest pain and palpitations.  Gastrointestinal: Negative for abdominal pain, blood in stool and anorexia.  Genitourinary:  Negative for dysuria, frequency and hematuria.  Musculoskeletal: Positive for back pain. Negative for arthralgias.  Skin: Negative.   Neurological: Positive for seizures and syncope. Negative for dizziness and speech difficulty.  Psychiatric/Behavioral: Negative for hallucinations and confusion.       Depression    Allergies  Ketorolac tromethamine; Sulfa drugs cross reactors; Tramadol; Penicillins; and Trazodone and nefazodone  Home Medications   Current Outpatient Rx  Name Route Sig Dispense Refill  . ASPIRIN-ACETAMINOPHEN-CAFFEINE 500-325-65 MG PO PACK Oral Take 1 Package by mouth as needed. For pain    . MUSCLE RUB 10-15 % EX CREA Topical Apply 1 application topically as needed. FOR PAIN (back pain)    . OXYCODONE-ACETAMINOPHEN 10-325 MG PO TABS Oral Take 1 tablet by mouth 2 (two) times daily.    [employer] OXYMORPHONE HCL ER 30 MG PO TB12 Oral Take 30 mg by mouth every 12 (twelve) hours.    [employer] PREGABALIN 300 MG PO CAPS Oral Take 300 mg by mouth 2 (two) times daily.    [employer] TIZANIDINE HCL 4 MG PO TABS Oral Take 4-12 mg by mouth 4 (four) times daily. 4mg  three times day and 12 mg at bedtime      BP 93/64  Pulse 85  Temp 98.4 F (36.9 C) (Oral)  Resp 18  Ht 5\' 9"  (1.753 m)  Wt 170 lb (77.111 kg)  BMI 25.10 kg/m2  SpO2 100%  Physical Exam  Nursing note and vitals reviewed. Constitutional: He is oriented to person, place, and time. He appears well-developed and well-nourished.  Non-toxic appearance.  HENT:  Head: Normocephalic.  Right Ear: Tympanic membrane and external ear normal.  Left Ear: Tympanic membrane and external ear normal.  Eyes: EOM and lids are normal. Pupils are equal, round, and reactive to light.  Neck: Normal range of motion. Neck supple. Carotid bruit is not present.  Cardiovascular: Normal rate, regular rhythm, normal heart sounds, intact distal pulses and normal pulses.   Pulmonary/Chest: Breath sounds normal. No respiratory distress.  Abdominal: Soft. Bowel sounds  are normal. There is no tenderness. There is no guarding.  Musculoskeletal: Normal range of motion.       There is a firm 2 inch  raised area of the mid forearm of the right arm. The area has some mild to moderate tenderness to palpation and manipulation. It is not hot. It is not red or showing any red streaking present. The radial pulse is 2+. Testing of the palmar arch is within normal limits. There is good capillary refill of the fingers of the right hand. Sensory is intact.   Lymphadenopathy:       Head (right side): No submandibular adenopathy present.       Head (left side): No submandibular adenopathy present.    He has no cervical adenopathy.  Neurological: He is alert and oriented to person, place, and time. He has normal strength. No cranial nerve deficit or sensory deficit.       Grip is symmetrical  Skin: Skin is warm and dry.  Psychiatric: He has a normal mood and affect. His speech is normal.    ED Course  CRITICAL CARE Performed by: Kathie Dike Authorized by: Kathie Dike Total critical care time: 30 minutes Critical care was necessary to treat or prevent imminent or life-threatening deterioration of the following conditions: CNS failure or compromise. Critical care was time spent personally by me on the following activities: development of treatment plan with patient or surrogate, examination of patient, ordering and performing treatments and interventions, ordering and review of laboratory studies, pulse oximetry, re-evaluation of patient's condition and review of old charts.   (including critical care time)  Labs Reviewed - No data to display Dg Forearm Right  09/09/2012  *RADIOLOGY REPORT*  Clinical Data: Forearm mass.  Rule out foreign body  RIGHT FOREARM - 2 VIEW  Comparison:  None.  Findings: There is no evidence of fracture or other focal bone lesions.  Soft tissues are unremarkable. Negative for foreign body.  IMPRESSION: Negative.   Original Report  Authenticated By: Camelia Phenes, M.D.      1. Cyst of soft tissue       MDM  I have reviewed nursing notes, vital signs, and all appropriate lab and imaging results for this patient. Upon discharge, the patient seems to be unresponsive. Stares into space. Would not follow commands or answer questions.  Has occasional jerking type movement. The patient's capillary blood glucose is 112. IV fluids are being started and the patient is being moved to the acute care area. (8:42 PM) After being moved to ACUTE care, and fluids started,  he is beginning to awaken. He is now talking. Vital signs stable. (9:00PM) The complete blood count shows a hemoglobin of 12.2 slightly low and hematocrit of 35.9 slightly low otherwise well within normal limits. Electrocardiogram shows a normal sinus rhythm, without life-threatening arrhythmia or STEMI.  Patient's care will be continued by Dr. Clarene Duke. Differential diagnosis includes atypical seizure, syncopal response to medication withdrawal, arrhythmia, and vasovagal response to pain.     This note completed by PA Beverely Pace.  Medical screening examination/treatment/procedure(s) were conducted as a shared visit with non-physician practitioner(s) and myself.  I personally evaluated the patient during the encounter Please see my separate note.  Taelor Moncada Allison Quarry, DO 09/11/12 2246

## 2012-09-09 NOTE — ED Notes (Signed)
Swollen red area to rt forearm, onset today,

## 2012-09-09 NOTE — ED Provider Notes (Signed)
History     CSN: [id number]  Arrival date & time 09/09/12  1734   First MD Initiated Contact with Patient 09/09/12 1932      Chief Complaint  Patient presents with  . Abscess  . Loss of Consciousness     Patient is a 46 y.o. male presenting with abscess and syncope.  Abscess   Loss of Consciousness  Pt was seen at 2050.  Per pt and multiple ED staff members, c/o pt with sudden onset and resolution of one episode of brief syncope which occurred while in fast track PTA.  ED staff was walking into the exam room to discharge pt when he was found laying on the stretcher unresponsive.  Pt did not awaken to CBG or sternal rub.  Pt awoke during IV placement by ED RN.  Pt immediately awake, alert. No confusion, no post-ictal state.  No reported seizure activity.  Pt states he "just felt like I was going to pass out and then I did."  States he was laying down at the time of symptoms.  States he does have hx of syncope but "just when I stand up too quick from laying down."  Denies he changed body positions before this episode.  Denies CP/palpitations, no SOB/cough, no abd pain, no back pain, no N/V/D, no focal motor weakness.    Past Medical History  Diagnosis Date  . Seizures   . Chronic pain   . Pacemaker   . Syncope   . Syncope   . Depression     Past Surgical History  Procedure Date  . Back surgery   . Neck surgery   . Knee arthroscopy   . Insert / replace / remove pacemaker   . Shoulder surgery     Family History  Problem Relation Age of Onset  . Hypertension Brother   . Heart attack Brother     History  Substance Use Topics  . Smoking status: Current Every Day Smoker -- 1.0 packs/day for 30 years    Types: Cigarettes  . Smokeless tobacco: Never Used  . Alcohol Use: No     pt reports a couple of sips of champagne on New Years      Review of Systems  Cardiovascular: Positive for syncope.   ROS: Statement: All systems negative except as marked or noted in the  HPI; Constitutional: Negative for fever and chills. ; ; Eyes: Negative for eye pain, redness and discharge. ; ; ENMT: Negative for ear pain, hoarseness, nasal congestion, sinus pressure and sore throat. ; ; Cardiovascular: Negative for chest pain, palpitations, diaphoresis, dyspnea and peripheral edema. ; ; Respiratory: Negative for cough, wheezing and stridor. ; ; Gastrointestinal: Negative for nausea, vomiting, diarrhea, abdominal pain, blood in stool, hematemesis, jaundice and rectal bleeding. . ; ; Genitourinary: Negative for dysuria, flank pain and hematuria. ; ; Musculoskeletal: Negative for back pain and neck pain. Negative for swelling and trauma.; ; Skin: Negative for pruritus, rash, abrasions, blisters, bruising and skin lesion.; ; Neuro: Negative for headache and neck stiffness. Negative for weakness, altered level of consciousness , altered mental status, extremity weakness, paresthesias, involuntary movement, seizure and +syncope.       Allergies  Ketorolac tromethamine; Sulfa drugs cross reactors; Tramadol; Penicillins; and Trazodone and nefazodone  Home Medications   Current Outpatient Rx  Name Route Sig Dispense Refill  . ASPIRIN-ACETAMINOPHEN-CAFFEINE 500-325-65 MG PO PACK Oral Take 1 Package by mouth as needed. For pain    . MUSCLE RUB 10-15 % EX  CREA Topical Apply 1 application topically as needed. FOR PAIN (back pain)    . OXYCODONE-ACETAMINOPHEN 10-325 MG PO TABS Oral Take 1 tablet by mouth 2 (two) times daily.    Marland Kitchen OXYMORPHONE HCL ER 30 MG PO TB12 Oral Take 30 mg by mouth every 12 (twelve) hours.    Marland Kitchen PREGABALIN 300 MG PO CAPS Oral Take 300 mg by mouth 2 (two) times daily.    Marland Kitchen TIZANIDINE HCL 4 MG PO TABS Oral Take 4-12 mg by mouth 4 (four) times daily. 4mg  three times day and 12 mg at bedtime      BP 108/78  Pulse 85  Temp 98.4 F (36.9 C) (Oral)  Resp 20  Ht 5\' 9"  (1.753 m)  Wt 170 lb (77.111 kg)  BMI 25.10 kg/m2  SpO2 99%  Physical Exam 2055: Physical  examination:  Nursing notes reviewed; Vital signs and O2 SAT reviewed;  Constitutional: Well developed, Well nourished, Well hydrated, In no acute distress; Head:  Normocephalic, atraumatic; Eyes: EOMI, PERRL, No scleral icterus; ENMT: Mouth and pharynx normal, Mucous membranes moist; Neck: Supple, Full range of motion, No lymphadenopathy; Cardiovascular: Regular rate and rhythm, No murmur, rub, or gallop; Respiratory: Breath sounds clear & equal bilaterally, No rales, rhonchi, wheezes.  Speaking full sentences with ease, Normal respiratory effort/excursion; Chest: Nontender, Movement normal; Abdomen: Soft, Nontender, Nondistended, Normal bowel sounds;; Extremities: Pulses normal, No tenderness, No edema, No calf edema or asymmetry.; Neuro: AA&Ox3, Major CN grossly intact.  Speech clear. No facial droop. No gross focal motor or sensory deficits in extremities.; Skin: Color normal, Warm, Dry; +soft nodule right dorsal forearm without overlying erythema.   ED Course  Procedures    MDM  MDM Reviewed: nursing note, vitals and previous chart Reviewed previous: ECG Interpretation: ECG, labs and x-ray    Date: 09/09/2012  Rate: 74  Rhythm: normal sinus rhythm  QRS Axis: normal  Intervals: normal  ST/T Wave abnormalities: normal  Conduction Disutrbances:none  Narrative Interpretation:   Old EKG Reviewed: changes noted; previous EKG dated 08/10/2012 was atrial-paced rhythm.   Results for orders placed during the hospital encounter of 09/09/12  GLUCOSE, CAPILLARY      Component Value Range   Glucose-Capillary 112 (*) 70 - 99 mg/dL  BASIC METABOLIC PANEL      Component Value Range   Sodium 137  135 - 145 mEq/L   Potassium 2.8 (*) 3.5 - 5.1 mEq/L   Chloride 101  96 - 112 mEq/L   CO2 22  19 - 32 mEq/L   Glucose, Bld 99  70 - 99 mg/dL   BUN 7  6 - 23 mg/dL   Creatinine, Ser 1.61  0.50 - 1.35 mg/dL   Calcium 9.1  8.4 - 09.6 mg/dL   GFR calc non Af Amer >90  >90 mL/min   GFR calc Af Amer >90   >90 mL/min  CBC WITH DIFFERENTIAL      Component Value Range   WBC 9.6  4.0 - 10.5 K/uL   RBC 4.02 (*) 4.22 - 5.81 MIL/uL   Hemoglobin 12.2 (*) 13.0 - 17.0 g/dL   HCT 04.5 (*) 40.9 - 81.1 %   MCV 89.3  78.0 - 100.0 fL   MCH 30.3  26.0 - 34.0 pg   MCHC 34.0  30.0 - 36.0 g/dL   RDW 91.4  78.2 - 95.6 %   Platelets 351  150 - 400 K/uL   Neutrophils Relative 72  43 - 77 %   Neutro Abs  6.9  1.7 - 7.7 K/uL   Lymphocytes Relative 19  12 - 46 %   Lymphs Abs 1.8  0.7 - 4.0 K/uL   Monocytes Relative 8  3 - 12 %   Monocytes Absolute 0.8  0.1 - 1.0 K/uL   Eosinophils Relative 1  0 - 5 %   Eosinophils Absolute 0.1  0.0 - 0.7 K/uL   Basophils Relative 0  0 - 1 %   Basophils Absolute 0.0  0.0 - 0.1 K/uL  URINE RAPID DRUG SCREEN (HOSP PERFORMED)      Component Value Range   Opiates NONE DETECTED  NONE DETECTED   Cocaine NONE DETECTED  NONE DETECTED   Benzodiazepines NONE DETECTED  NONE DETECTED   Amphetamines NONE DETECTED  NONE DETECTED   Tetrahydrocannabinol POSITIVE (*) NONE DETECTED   Barbiturates NONE DETECTED  NONE DETECTED  TROPONIN I      Component Value Range   Troponin I <0.30  <0.30 ng/mL  URINALYSIS, ROUTINE W REFLEX MICROSCOPIC      Component Value Range   Color, Urine YELLOW  YELLOW   APPearance CLEAR  CLEAR   Specific Gravity, Urine <1.005 (*) 1.005 - 1.030   pH 6.0  5.0 - 8.0   Glucose, UA NEGATIVE  NEGATIVE mg/dL   Hgb urine dipstick NEGATIVE  NEGATIVE   Bilirubin Urine NEGATIVE  NEGATIVE   Ketones, ur NEGATIVE  NEGATIVE mg/dL   Protein, ur NEGATIVE  NEGATIVE mg/dL   Urobilinogen, UA 0.2  0.0 - 1.0 mg/dL   Nitrite NEGATIVE  NEGATIVE   Leukocytes, UA NEGATIVE  NEGATIVE   Dg Forearm Right 09/09/2012  *RADIOLOGY REPORT*  Clinical Data: Forearm mass.  Rule out foreign body  RIGHT FOREARM - 2 VIEW  Comparison:  None.  Findings: There is no evidence of fracture or other focal bone lesions.  Soft tissues are unremarkable. Negative for foreign body.  IMPRESSION: Negative.    Original Report Authenticated By: Camelia Phenes, M.D.    Dg Chest Port 1 View 09/09/2012  *RADIOLOGY REPORT*  Clinical Data: Syncope  PORTABLE CHEST - 1 VIEW  Comparison: 06/20/2011  Findings: Right subclavian dual lead pacemaker stable in position. Relatively low lung volumes.  Lungs clear.  Heart size normal.  No effusion.  Regional bones unremarkable.  IMPRESSION:  No acute disease   Original Report Authenticated By: Osa Craver, M.D.      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 [employer] pacer interrogated today: no acute findings per report.  Pt insistent that his usual syncope is when he "stands up from laying down."  Pt insistent he was laying down when syncopal episode occurred today; multiple ED staff witnesses concur with this statement.  No post-ictal state to suggest seizure tonight.  SBP 90's on arrival to ED (at Triage); increased to 110's with IVF bolus.  Potassium repleted PO and IV.  Currently feels "ok."  T/C to [facility] Dr. Phillips Odor, case discussed, including:  HPI, pertinent PM/SHx, VS/PE, dx testing, ED course and treatment:  Agreeable to observation admit, requests to write temporary orders, obtain tele bed.           Laray Anger, DO 09/11/12 2242

## 2012-09-09 NOTE — ED Notes (Signed)
Pt currently alert and oriented no signs of distress

## 2012-09-09 NOTE — ED Notes (Signed)
Spoke with [employer] and was advised pt had no abnormal recording tonight. Report will be faxed.

## 2012-09-09 NOTE — ED Notes (Signed)
Pt pacemaker checked w/ [employer] & report given to EDP.

## 2012-09-09 NOTE — ED Notes (Signed)
Pt lying on bed in fetal position while obtaining CGB. Pt will not respond to questions. Moderate sternal rub, pt responded by swinging arms and opened eyes. No verbal response.

## 2012-09-09 NOTE — ED Notes (Signed)
Pt asked for another drink and some nabs. Both provided to pt. Pt continues to sit in chair watching TV.

## 2012-09-09 NOTE — ED Notes (Signed)
Pt sitting in chair watching TV, presents with abscess to Rt forearm. Reports wasn't there last night but awoke with it. Denies pain, denies fever. NAD noted.

## 2012-09-10 ENCOUNTER — Encounter (HOSPITAL_COMMUNITY): Payer: Self-pay | Admitting: *Deleted

## 2012-09-10 ENCOUNTER — Observation Stay (HOSPITAL_COMMUNITY): Payer: Medicare Other

## 2012-09-10 DIAGNOSIS — R55 Syncope and collapse: Secondary | ICD-10-CM

## 2012-09-10 DIAGNOSIS — I951 Orthostatic hypotension: Secondary | ICD-10-CM | POA: Diagnosis present

## 2012-09-10 DIAGNOSIS — M7989 Other specified soft tissue disorders: Secondary | ICD-10-CM

## 2012-09-10 DIAGNOSIS — Z720 Tobacco use: Secondary | ICD-10-CM | POA: Diagnosis present

## 2012-09-10 DIAGNOSIS — L0291 Cutaneous abscess, unspecified: Secondary | ICD-10-CM | POA: Diagnosis present

## 2012-09-10 DIAGNOSIS — R001 Bradycardia, unspecified: Secondary | ICD-10-CM

## 2012-09-10 DIAGNOSIS — M5090 Cervical disc disorder, unspecified, unspecified cervical region: Secondary | ICD-10-CM | POA: Diagnosis present

## 2012-09-10 DIAGNOSIS — R7989 Other specified abnormal findings of blood chemistry: Secondary | ICD-10-CM | POA: Diagnosis present

## 2012-09-10 DIAGNOSIS — G8929 Other chronic pain: Secondary | ICD-10-CM

## 2012-09-10 DIAGNOSIS — E876 Hypokalemia: Secondary | ICD-10-CM | POA: Diagnosis present

## 2012-09-10 DIAGNOSIS — G40909 Epilepsy, unspecified, not intractable, without status epilepticus: Secondary | ICD-10-CM | POA: Diagnosis present

## 2012-09-10 DIAGNOSIS — M1990 Unspecified osteoarthritis, unspecified site: Secondary | ICD-10-CM | POA: Diagnosis present

## 2012-09-10 MED ORDER — ASPIRIN EC 81 MG PO TBEC
81.0000 mg | DELAYED_RELEASE_TABLET | Freq: Every day | ORAL | Status: DC
  Administered 2012-09-10: 81 mg via ORAL
  Filled 2012-09-10: qty 1

## 2012-09-10 MED ORDER — SODIUM CHLORIDE 0.9 % IJ SOLN
INTRAMUSCULAR | Status: AC
  Administered 2012-09-10: 3 mL
  Filled 2012-09-10: qty 3

## 2012-09-10 MED ORDER — THIAMINE HCL 100 MG/ML IJ SOLN
Freq: Once | INTRAVENOUS | Status: AC
  Administered 2012-09-10: 05:00:00 via INTRAVENOUS
  Filled 2012-09-10: qty 1000

## 2012-09-10 MED ORDER — OXYCODONE HCL 5 MG PO TABS
5.0000 mg | ORAL_TABLET | Freq: Two times a day (BID) | ORAL | Status: DC
  Administered 2012-09-10: 5 mg via ORAL
  Filled 2012-09-10 (×2): qty 1

## 2012-09-10 MED ORDER — ONDANSETRON HCL 4 MG/2ML IJ SOLN: 4.0000 mg | Freq: Three times a day (TID) | INTRAMUSCULAR | Status: AC | PRN

## 2012-09-10 MED ORDER — SODIUM CHLORIDE 0.9 % IJ SOLN
3.0000 mL | Freq: Two times a day (BID) | INTRAMUSCULAR | Status: DC
  Administered 2012-09-10 (×2): 3 mL via INTRAVENOUS
  Filled 2012-09-10 (×2): qty 3

## 2012-09-10 MED ORDER — DOXYCYCLINE HYCLATE 100 MG PO TABS: 100.0000 mg | ORAL_TABLET | Freq: Two times a day (BID) | ORAL | Status: DC

## 2012-09-10 MED ORDER — TIZANIDINE HCL 4 MG PO TABS
4.0000 mg | ORAL_TABLET | Freq: Four times a day (QID) | ORAL | Status: DC
  Administered 2012-09-10: 4 mg via ORAL
  Filled 2012-09-10 (×11): qty 1

## 2012-09-10 MED ORDER — THIAMINE HCL 100 MG/ML IJ SOLN
INTRAMUSCULAR | Status: AC
  Filled 2012-09-10: qty 2

## 2012-09-10 MED ORDER — DOCUSATE SODIUM 100 MG PO CAPS
100.0000 mg | ORAL_CAPSULE | Freq: Two times a day (BID) | ORAL | Status: DC
  Administered 2012-09-10 (×2): 100 mg via ORAL
  Filled 2012-09-10 (×2): qty 1

## 2012-09-10 MED ORDER — INFLUENZA VIRUS VACC SPLIT PF IM SUSP
0.5000 mL | INTRAMUSCULAR | Status: DC
  Filled 2012-09-10: qty 0.5

## 2012-09-10 MED ORDER — ONDANSETRON HCL 4 MG PO TABS: 4.0000 mg | ORAL_TABLET | Freq: Four times a day (QID) | ORAL | Status: DC | PRN

## 2012-09-10 MED ORDER — FOLIC ACID 5 MG/ML IJ SOLN
INTRAMUSCULAR | Status: AC
  Filled 2012-09-10: qty 0.2

## 2012-09-10 MED ORDER — ENOXAPARIN SODIUM 40 MG/0.4ML ~~LOC~~ SOLN
40.0000 mg | SUBCUTANEOUS | Status: DC
  Filled 2012-09-10: qty 0.4

## 2012-09-10 MED ORDER — OXYCODONE-ACETAMINOPHEN 5-325 MG PO TABS
1.0000 | ORAL_TABLET | Freq: Two times a day (BID) | ORAL | Status: DC
  Administered 2012-09-10: 1 via ORAL
  Filled 2012-09-10 (×2): qty 1

## 2012-09-10 MED ORDER — OXYCODONE-ACETAMINOPHEN 10-325 MG PO TABS: 1.0000 | ORAL_TABLET | Freq: Two times a day (BID) | ORAL | Status: DC

## 2012-09-10 MED ORDER — M.V.I. ADULT IV INJ
INJECTION | INTRAVENOUS | Status: AC
  Filled 2012-09-10: qty 10

## 2012-09-10 MED ORDER — OXYMORPHONE HCL ER 30 MG PO TB12: 30.0000 mg | ORAL_TABLET | Freq: Two times a day (BID) | ORAL | Status: DC

## 2012-09-10 MED ORDER — ALUM & MAG HYDROXIDE-SIMETH 200-200-20 MG/5ML PO SUSP: 30.0000 mL | Freq: Four times a day (QID) | ORAL | Status: DC | PRN

## 2012-09-10 MED ORDER — DOXYCYCLINE HYCLATE 100 MG PO TABS
100.0000 mg | ORAL_TABLET | Freq: Two times a day (BID) | ORAL | Status: DC
  Administered 2012-09-10 (×2): 100 mg via ORAL
  Filled 2012-09-10 (×2): qty 1

## 2012-09-10 MED ORDER — OXYCODONE HCL 5 MG PO TABS
10.0000 mg | ORAL_TABLET | Freq: Once | ORAL | Status: AC
  Administered 2012-09-10: 10 mg via ORAL
  Filled 2012-09-10: qty 2

## 2012-09-10 MED ORDER — MORPHINE SULFATE ER 30 MG PO TBCR
60.0000 mg | EXTENDED_RELEASE_TABLET | Freq: Two times a day (BID) | ORAL | Status: DC
  Administered 2012-09-10: 60 mg via ORAL
  Filled 2012-09-10: qty 2

## 2012-09-10 MED ORDER — SODIUM CHLORIDE 0.9 % IV SOLN: INTRAVENOUS | Status: DC

## 2012-09-10 MED ORDER — PNEUMOCOCCAL VAC POLYVALENT 25 MCG/0.5ML IJ INJ
0.5000 mL | INJECTION | INTRAMUSCULAR | Status: DC
  Filled 2012-09-10: qty 0.5

## 2012-09-10 MED ORDER — PREGABALIN 75 MG PO CAPS
300.0000 mg | ORAL_CAPSULE | Freq: Two times a day (BID) | ORAL | Status: DC
  Administered 2012-09-10: 300 mg via ORAL
  Filled 2012-09-10: qty 4

## 2012-09-10 MED ORDER — ONDANSETRON HCL 4 MG/2ML IJ SOLN: 4.0000 mg | Freq: Four times a day (QID) | INTRAMUSCULAR | Status: DC | PRN

## 2012-09-10 MED ORDER — IOHEXOL 350 MG/ML SOLN
100.0000 mL | Freq: Once | INTRAVENOUS | Status: AC | PRN
  Administered 2012-09-10: 100 mL via INTRAVENOUS

## 2012-09-10 NOTE — Progress Notes (Signed)
UR Chart Review Completed  

## 2012-09-10 NOTE — H&P (Signed)
[facility] History and Physical  Isaac Jensen ZOX:[id number] DOB: February 02, 1966 DOA: 09/09/2012  Referring physician: EDP McMannus PCP: No primary provider on file.  Specialists: Psychiatry  Chief Complaint: Syncope  HPI: Isaac Jensen is a 46 y.o. male with a medical history significant for symptomatic sinus bradycardia s/p pacemaker insertion in 2011, severe recurrent major depression, and chronic pain on multiple opiate and adjuvant medications who came into the [facility] for evaluation of a localized area of swelling and erythema on his right forearm but had an episode of syncope where he was found by staff unresponsive on the stretcher- he regained consciousness within a few seconds and reported a sensation of passing out followed by a black out until he woke up with staff at his bedside. No changes on the monitor and his pacer was interrogated and is normal. 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  No witnessed seizure activity, he denies withdrawing from or not taking any of his home medications and denies illicit drug use. He was also orthostatic in ED, improved with 2 liters of fluid.   Review of Systems: Review of Systems  Constitutional: Negative.   HENT: Positive for neck pain.   Eyes: Negative.   Respiratory: Negative.   Cardiovascular: Negative.   Gastrointestinal: Positive for heartburn.  Genitourinary: Negative.   Musculoskeletal: Positive for myalgias, back pain and joint pain.  Skin: Positive for rash.       Swelling forearm right  Neurological: Positive for dizziness and loss of consciousness.  Endo/Heme/Allergies: Negative.   Psychiatric/Behavioral: Positive for depression. The patient is nervous/anxious and has insomnia.   All other systems reviewed and are negative.     Past Medical History  Diagnosis Date  . Seizures   . Chronic pain   . Pacemaker   .  Syncope   . Syncope   . Depression    Past Surgical History  Procedure Date  . Back surgery   . Neck surgery   . Knee arthroscopy   . Insert / replace / remove pacemaker   . Shoulder surgery    Social History:  reports that he has been smoking Cigarettes.  He has a 30 pack-year smoking history. He has never used smokeless tobacco. He reports that he uses illicit drugs (Marijuana). He reports that he does not drink alcohol.   Allergies  Allergen Reactions  . Ketorolac Tromethamine Shortness Of Breath  . Sulfa Drugs Cross Reactors Shortness Of Breath  . Tramadol Shortness Of Breath  . Penicillins Other (See Comments)    vomiting  . Trazodone And Nefazodone Other (See Comments)    REACTION: ALTERS MENTAL STATUS    Family History  Problem Relation Age of Onset  . Hypertension Brother   . Heart attack Brother     Prior to Admission medications   Medication Sig Start Date End Date Taking? Authorizing Provider  Aspirin-Acetaminophen-Caffeine (GOODYS EXTRA STRENGTH) [phone] MG PACK Take 1 Package by mouth as needed. For pain   Yes Historical Provider, MD  Menthol-Methyl Salicylate (MUSCLE RUB) 10-15 % CREA Apply 1 application topically as needed. FOR PAIN (back pain)   Yes Historical Provider, MD  oxyCODONE-acetaminophen (PERCOCET) 10-325 MG per tablet Take 1 tablet by mouth 2 (two) times daily.   Yes Historical Provider, MD  oxymorphone (OPANA ER) 30 MG 12 hr tablet Take 30 mg by mouth every 12 (twelve) hours.   Yes Historical Provider,  MD  pregabalin (LYRICA) 300 MG capsule Take 300 mg by mouth 2 (two) times daily.   Yes Historical Provider, MD  tiZANidine (ZANAFLEX) 4 MG tablet Take 4-12 mg by mouth 4 (four) times daily. 4mg  three times day and 12 mg at bedtime   Yes Historical Provider, MD   Physical Exam: Filed Vitals:   09/09/12 2157 09/09/12 2158 09/10/12 0058 09/10/12 0136  BP: 112/74 108/78 130/79 112/78  Pulse: 65 85 68 62  Temp:    98.2 F (36.8 C)  TempSrc:     Oral  Resp:   20 20  Height:    5\' 9"  (1.753 m)  Weight:    74.5 kg (164 lb 3.9 oz)  SpO2:   98% 100%     General:  Well appearing. Alert cooperative  Eyes: normal  ENT: normal  Neck: normal  Cardiovascular: RRR, no mrg-- no bruits  Respiratory: scattered wheezing  Abdomen:  S, NT, normal BS  Skin: no rashes, there is a slightly red superficial swelling of his right anterior/ventral forearm-tender to palpation, warm, no fluctuent mass or lesion  Musculoskeletal: normal  Psychiatric: normal, appropriate  Neurologic: non-focal  Labs on Admission:  Basic Metabolic Panel:  Lab 09/09/12 4098  NA 137  K 2.8*  CL 101  CO2 22  GLUCOSE 99  BUN 7  CREATININE 0.70  CALCIUM 9.1  MG --  PHOS --   L Lab 09/09/12 2033  WBC 9.6  NEUTROABS 6.9  HGB 12.2*  HCT 35.9*  MCV 89.3  PLT 351   Cardiac Enzymes:  Lab 09/09/12 2106  CKTOTAL --  CKMB --  CKMBINDEX --  TROPONINI <0.30    CBG:  Lab 09/09/12 2041  GLUCAP 112*    Radiological Exams on Admission: Dg Forearm Right  09/09/2012  *RADIOLOGY REPORT*  Clinical Data: Forearm mass.  Rule out foreign body  RIGHT FOREARM - 2 VIEW  Comparison:  None.  Findings: There is no evidence of fracture or other focal bone lesions.  Soft tissues are unremarkable. Negative for foreign body.  IMPRESSION: Negative.   Original Report Authenticated By: Camelia Phenes, M.D.    Ct Angio Chest Pe W/cm &/or Wo Cm  09/10/2012  *RADIOLOGY REPORT*  Clinical Data: Chest pain.  Elevated D-dimer.  CT ANGIOGRAPHY CHEST  Technique:  Multidetector CT imaging of the chest using the standard protocol during bolus administration of intravenous contrast. Multiplanar reconstructed images including MIPs were obtained and reviewed to evaluate the vascular anatomy.  Contrast: OMNIPAQUE IOHEXOL 350 MG/ML SOLN  Comparison: Plain film chest 09/09/2012 and CT chest 04/07/2011.  Findings: No pulmonary embolus is identified.  There is no pleural or  pericardial effusion.  Heart size is normal.  No axillary, hilar or mediastinal lymphadenopathy is seen.  Pacemaking device is noted.  Lungs demonstrate emphysematous change in the apices.  The lungs are otherwise unremarkable.  Incidentally imaged upper abdomen is unremarkable.  There is no focal bony abnormality.  IMPRESSION: Negative for pulmonary embolus.  No acute finding.  Emphysematous disease in the lung apices noted.   Original Report Authenticated By: Bernadene Bell. Maricela Curet, M.D.    Dg Chest Port 1 View  09/09/2012  *RADIOLOGY REPORT*  Clinical Data: Syncope  PORTABLE CHEST - 1 VIEW  Comparison: 06/20/2011  Findings: Right subclavian dual lead pacemaker stable in position. Relatively low lung volumes.  Lungs clear.  Heart size normal.  No effusion.  Regional bones unremarkable.  IMPRESSION:  No acute disease   Original Report  Authenticated By: Osa Craver, M.D.     EKG: Independently reviewed. NSR, atrial paced.  Assessment/Plan Principal Problem:  *Syncope Active Problems:  Moderate recurrent major depression  Skin abscess  Chronic pain  History of sinus bradycardia  Elevated d-dimer  Tobacco abuse  Cervical disc disease  DJD (degenerative joint disease)  Seizure disorder   1. Syncope, occured in ED incidentally, not witnessed, came in with arm swelling/pain. Was orthostatic in Ed and responded to fluids, no history of decreaed PO intake, he is on a number of strong pain and psychiatric/neuropathic medications -Opana, Zanaflex, Lyrica, Risperadol, oxycodone which I suspect is the underlying cause of his syncopal episode and possibly his dehydration if he is over medicated and not drinking fluids. His D. Dimer is also elevated w/o any obvious underlying cause which raises suspicion for PE trelated syncope.  Plan: 1. Monitor on Tele 2. Gradually restart his chronic pain regimen 3. Ct angio chest is pending 4. Carotid dopplers 5. 2D echo. 6. May need neuro-consult if  reoccurs-doubt seizure, specially if he is taking his neuropathic pain meds. 7. IV fluid hydration repeat ortho vitals in AM. 8. Oral Doxy for 7 days for his forearm superficial skin infection.   Code Status: Full Code Family Communication: Discussed with patient in detail. Disposition Plan: probable d/c home tomorrow after work-up is complete.  Time spent: 56  [facility] Pager [phone]   If 7PM-7AM, please contact night-coverage [email or web address] Password [email or web address] 09/10/2012, 4:36 AM

## 2012-09-10 NOTE — Progress Notes (Signed)
09/10/12 1416 Late entry for this morning. Patient states syncopal episode in the ER last night and also at home few days ago. Discussed with Dr Lendell Caprice this morning prior to giving scheduled pain medications, as patient takes at home, stated okay to continue pain medications as ordered. Requested we check orthostatic vtials and assist patient to ambulate in hallway. orthostatic vitals checked and documented. Pt ambulated in hallway with nurse tech supervision, states tolerated well with no dizziness. Notified Dr Lendell Caprice. Earnstine Regal, RN

## 2012-09-10 NOTE — Progress Notes (Signed)
*  PRELIMINARY RESULTS* Echocardiogram 2D Echocardiogram has been performed.  Conrad  09/10/2012, 11:56 AM

## 2012-09-10 NOTE — Discharge Summary (Signed)
Physician Discharge Summary  Patient ID: Isaac Jensen MRN: [id number] DOB/AGE: [birth year] 48 y.o.  Admit date: 09/09/2012 Discharge date: 09/10/2012  Discharge Diagnoses:  Principal Problem:  *Syncope Active Problems:  Orthostatic hypotension  Skin abscess  Chronic pain  Hypokalemia  Moderate recurrent major depression  History of sinus bradycardia  Elevated d-dimer  Tobacco abuse  Cervical disc disease  DJD (degenerative joint disease)  Seizure disorder  Tests pending at the time of discharge: Echocardiogram    Medication List     As of 09/10/2012  1:22 PM    TAKE these medications         doxycycline 100 MG tablet   Commonly known as: VIBRA-TABS   Take 1 tablet (100 mg total) by mouth every 12 (twelve) hours.      GOODYS EXTRA STRENGTH 500-325-65 MG Pack   Generic drug: Aspirin-Acetaminophen-Caffeine   Take 1 Package by mouth as needed. For pain      Muscle Rub 10-15 % Crea   Apply 1 application topically as needed. FOR PAIN (back pain)      oxyCODONE-acetaminophen 10-325 MG per tablet   Commonly known as: PERCOCET   Take 1 tablet by mouth 2 (two) times daily.      oxymorphone 30 MG 12 hr tablet   Commonly known as: OPANA ER   Take 30 mg by mouth every 12 (twelve) hours.      pregabalin 300 MG capsule   Commonly known as: LYRICA   Take 300 mg by mouth 2 (two) times daily.      ZANAFLEX 4 MG tablet   Generic drug: tiZANidine   Take 4-12 mg by mouth 4 (four) times daily. 4mg  three times day and 12 mg at bedtime            Discharge Orders    Future Orders Please Complete By Expires   Diet general      Discharge instructions      Comments:   Apply moist heat to the raised area on arm.   Activity as tolerated - No restrictions         Follow-up Information    Schedule an appointment as soon as possible for a visit with Dalia Heading, MD.   Contact information:   [address] [place] [address] [phone]           Disposition: Home  Discharged Condition:  stable   Consults:  none  Labs:   Results for orders placed during the hospital encounter of 09/09/12 (from the past 48 hour(s))  BASIC METABOLIC PANEL     Status: Abnormal   Collection Time   09/09/12  8:33 PM      Component Value Range Comment   Sodium 137  135 - 145 mEq/L    Potassium 2.8 (*) 3.5 - 5.1 mEq/L    Chloride 101  96 - 112 mEq/L    CO2 22  19 - 32 mEq/L    Glucose, Bld 99  70 - 99 mg/dL    BUN 7  6 - 23 mg/dL    Creatinine, Ser 4.69  0.50 - 1.35 mg/dL    Calcium 9.1  8.4 - 62.9 mg/dL    GFR calc non Af Amer >90  >90 mL/min    GFR calc Af Amer >90  >90 mL/min   CBC WITH DIFFERENTIAL     Status: Abnormal   Collection Time   09/09/12  8:33 PM      Component Value Range Comment  WBC 9.6  4.0 - 10.5 K/uL    RBC 4.02 (*) 4.22 - 5.81 MIL/uL    Hemoglobin 12.2 (*) 13.0 - 17.0 g/dL    HCT 16.1 (*) 09.6 - 52.0 %    MCV 89.3  78.0 - 100.0 fL    MCH 30.3  26.0 - 34.0 pg    MCHC 34.0  30.0 - 36.0 g/dL    RDW 04.5  40.9 - 81.1 %    Platelets 351  150 - 400 K/uL    Neutrophils Relative 72  43 - 77 %    Neutro Abs 6.9  1.7 - 7.7 K/uL    Lymphocytes Relative 19  12 - 46 %    Lymphs Abs 1.8  0.7 - 4.0 K/uL    Monocytes Relative 8  3 - 12 %    Monocytes Absolute 0.8  0.1 - 1.0 K/uL    Eosinophils Relative 1  0 - 5 %    Eosinophils Absolute 0.1  0.0 - 0.7 K/uL    Basophils Relative 0  0 - 1 %    Basophils Absolute 0.0  0.0 - 0.1 K/uL   GLUCOSE, CAPILLARY     Status: Abnormal   Collection Time   09/09/12  8:41 PM      Component Value Range Comment   Glucose-Capillary 112 (*) 70 - 99 mg/dL   TROPONIN I     Status: Normal   Collection Time   09/09/12  9:06 PM      Component Value Range Comment   Troponin I <0.30  <0.30 ng/mL   URINE RAPID DRUG SCREEN (HOSP PERFORMED)     Status: Abnormal   Collection Time   09/09/12  9:35 PM      Component Value Range Comment   Opiates NONE DETECTED  NONE DETECTED    Cocaine NONE  DETECTED  NONE DETECTED    Benzodiazepines NONE DETECTED  NONE DETECTED    Amphetamines NONE DETECTED  NONE DETECTED    Tetrahydrocannabinol POSITIVE (*) NONE DETECTED    Barbiturates NONE DETECTED  NONE DETECTED   URINALYSIS, ROUTINE W REFLEX MICROSCOPIC     Status: Abnormal   Collection Time   09/09/12  9:40 PM      Component Value Range Comment   Color, Urine YELLOW  YELLOW    APPearance CLEAR  CLEAR    Specific Gravity, Urine <1.005 (*) 1.005 - 1.030    pH 6.0  5.0 - 8.0    Glucose, UA NEGATIVE  NEGATIVE mg/dL    Hgb urine dipstick NEGATIVE  NEGATIVE    Bilirubin Urine NEGATIVE  NEGATIVE    Ketones, ur NEGATIVE  NEGATIVE mg/dL    Protein, ur NEGATIVE  NEGATIVE mg/dL    Urobilinogen, UA 0.2  0.0 - 1.0 mg/dL    Nitrite NEGATIVE  NEGATIVE    Leukocytes, UA NEGATIVE  NEGATIVE MICROSCOPIC NOT DONE ON URINES WITH NEGATIVE PROTEIN, BLOOD, LEUKOCYTES, NITRITE, OR GLUCOSE <1000 mg/dL.  D-DIMER, QUANTITATIVE     Status: Abnormal   Collection Time   09/10/12 12:52 AM      Component Value Range Comment   D-Dimer, Quant 1.16 (*) 0.00 - 0.48 ug/mL-FEU     Diagnostics:  Dg Forearm Right  09/09/2012  *RADIOLOGY REPORT*  Clinical Data: Forearm mass.  Rule out foreign body  RIGHT FOREARM - 2 VIEW  Comparison:  None.  Findings: There is no evidence of fracture or other focal bone lesions.  Soft tissues are unremarkable. Negative for  foreign body.  IMPRESSION: Negative.   Original Report Authenticated By: Camelia Phenes, M.D.    Ct Angio Chest Pe W/cm &/or Wo Cm  09/10/2012  *RADIOLOGY REPORT*  Clinical Data: Chest pain.  Elevated D-dimer.  CT ANGIOGRAPHY CHEST  Technique:  Multidetector CT imaging of the chest using the standard protocol during bolus administration of intravenous contrast. Multiplanar reconstructed images including MIPs were obtained and reviewed to evaluate the vascular anatomy.  Contrast: OMNIPAQUE IOHEXOL 350 MG/ML SOLN  Comparison: Plain film chest 09/09/2012 and CT  chest 04/07/2011.  Findings: No pulmonary embolus is identified.  There is no pleural or pericardial effusion.  Heart size is normal.  No axillary, hilar or mediastinal lymphadenopathy is seen.  Pacemaking device is noted.  Lungs demonstrate emphysematous change in the apices.  The lungs are otherwise unremarkable.  Incidentally imaged upper abdomen is unremarkable.  There is no focal bony abnormality.  IMPRESSION: Negative for pulmonary embolus.  No acute finding.  Emphysematous disease in the lung apices noted.   Original Report Authenticated By: Bernadene Bell. Maricela Curet, M.D.    Dg Chest Port 1 View  09/09/2012  *RADIOLOGY REPORT*  Clinical Data: Syncope  PORTABLE CHEST - 1 VIEW  Comparison: 06/20/2011  Findings: Right subclavian dual lead pacemaker stable in position. Relatively low lung volumes.  Lungs clear.  Heart size normal.  No effusion.  Regional bones unremarkable.  IMPRESSION:  No acute disease   Original Report Authenticated By: Thora Lance III, M.D.    EKG: Normal sinus rhythm Possible Left atrial enlargement  Full Code   Hospital Course: See H&P for complete admission details. The patient is a 46 year old white male who came to the emergency room with a several-day history of worsening right forearm swelling. He woke up one night and thought he had been bitten by a bug. The area was raised. Over the ensuing several days, the area became larger. He's had no fevers or chills. His appetite has been normal. The ED provider felt that it was a cyst and was going to refer her as an outpatient to general surgery. However, the patient had a brief unresponsive episode and was therefore admitted to the hospitalist. He has previous history of recurrent syncope, mainly with standing. He did have a slight orthostatic drop in blood pressure. He had no seizure activity or postictal period. He has been eating well. He is on multiple opiates. His potassium was 2.8. He was observed on telemetry. His  pacemaker was interrogated. His potassium was repleted. He was given IV fluids. He is feeling fine, ambulating. His d-dimer was elevated, CT angiogram is negative for PE. Echocardiogram has been performed, result is pending. The admitting physician felt the area on his arm could be an infection and was started on doxycycline. This can be continued and he can followup with surgery as an outpatient. He had no leukocytosis or fever. He is interested in finding a primary care provider and is interested in seeing Dr. Delbert Harness. I've asked nursing staff to arrange followup with surgery and primary care.  Discharge Exam:  Blood pressure 92/58, pulse 92, temperature 98.3 F (36.8 C), temperature source Oral, resp. rate 20, height 5\' 9"  (1.753 m), weight 74.5 kg (164 lb 3.9 oz), SpO2 100.00%.  General: Nontoxic. Sitting in chair. Comfortable. Alert oriented and appropriate. Lungs clear to auscultation bilaterally without wheezes rhonchi or rales Cardiovascular regular rate rhythm without murmurs gallops rubs Extremities right arm with rubbery nodule without any tenderness, drainage. Slightly erythematous.  SignedChristiane Ha 09/10/2012, 1:22 PM

## 2012-09-10 NOTE — Progress Notes (Signed)
09/10/12 1422 Patient discharged home, reviewed discharge instructions with patient, given copy of instructions, med list, prescription, f/u appointment information via teachback method. States prefers to call and set up f/u appointments himself,. Given smoking cessation information as well. Preferred not to have flu and pneumonia vaccines before discharge. Pt left floor in stable condition via ambulation (at his request) accompanied by nurse tech. Earnstine Regal, RN

## 2012-09-11 LAB — URINE CULTURE: Colony Count: NO GROWTH

## 2012-11-05 ENCOUNTER — Encounter (HOSPITAL_COMMUNITY): Payer: Self-pay | Admitting: *Deleted

## 2012-11-05 ENCOUNTER — Emergency Department (HOSPITAL_COMMUNITY)
Admission: EM | Admit: 2012-11-05 | Discharge: 2012-11-05 | Disposition: A | Discharge status: Home / Self Care | Payer: Medicare Other | Attending: Emergency Medicine | Admitting: Emergency Medicine

## 2012-11-05 DIAGNOSIS — G40909 Epilepsy, unspecified, not intractable, without status epilepticus: Secondary | ICD-10-CM | POA: Insufficient documentation

## 2012-11-05 DIAGNOSIS — L0291 Cutaneous abscess, unspecified: Secondary | ICD-10-CM | POA: Insufficient documentation

## 2012-11-05 DIAGNOSIS — M7989 Other specified soft tissue disorders: Secondary | ICD-10-CM | POA: Insufficient documentation

## 2012-11-05 DIAGNOSIS — F17200 Nicotine dependence, unspecified, uncomplicated: Secondary | ICD-10-CM | POA: Insufficient documentation

## 2012-11-05 DIAGNOSIS — R609 Edema, unspecified: Secondary | ICD-10-CM

## 2012-11-05 DIAGNOSIS — Z950 Presence of cardiac pacemaker: Secondary | ICD-10-CM | POA: Insufficient documentation

## 2012-11-05 DIAGNOSIS — Z79899 Other long term (current) drug therapy: Secondary | ICD-10-CM | POA: Insufficient documentation

## 2012-11-05 DIAGNOSIS — G8929 Other chronic pain: Secondary | ICD-10-CM | POA: Insufficient documentation

## 2012-11-05 DIAGNOSIS — Z8659 Personal history of other mental and behavioral disorders: Secondary | ICD-10-CM | POA: Insufficient documentation

## 2012-11-05 MED ORDER — OXYCODONE-ACETAMINOPHEN 5-325 MG PO TABS
1.0000 | ORAL_TABLET | Freq: Once | ORAL | Status: AC
  Administered 2012-11-05: 1 via ORAL
  Filled 2012-11-05: qty 1

## 2012-11-05 MED ORDER — DOXYCYCLINE HYCLATE 100 MG PO TABS
100.0000 mg | ORAL_TABLET | Freq: Once | ORAL | Status: AC
  Administered 2012-11-05: 100 mg via ORAL
  Filled 2012-11-05: qty 1

## 2012-11-05 MED ORDER — DOXYCYCLINE HYCLATE 100 MG PO TABS: 100.0000 mg | ORAL_TABLET | Freq: Two times a day (BID) | ORAL | Status: DC

## 2012-11-05 NOTE — ED Notes (Addendum)
Pt with abscess to left AC area, states that area busted last night after shower, denies injections by self to area, states had an IV there about a month ago

## 2012-11-05 NOTE — ED Provider Notes (Signed)
History     CSN: [id number]  Arrival date & time 11/05/12  1321   First MD Initiated Contact with Patient 11/05/12 1459      Chief Complaint  Patient presents with  . Abscess     HPI Pt was seen at 1505.   Per pt, c/o gradual onset and persistence of constant left AC "swelling" for the past 1 month.  States the area "broke open and a lot of pus came out of it" last night when he was in the shower.  Denies injury to area, denies injecting himself, no fevers, no rash, no bleeding.      Past Medical History  Diagnosis Date  . Seizures   . Chronic pain   . Pacemaker   . Syncope   . Syncope   . Depression     Past Surgical History  Procedure Date  . Back surgery   . Neck surgery   . Knee arthroscopy   . Insert / replace / remove pacemaker   . Shoulder surgery     Family History  Problem Relation Age of Onset  . Hypertension Brother   . Heart attack Brother     History  Substance Use Topics  . Smoking status: Current Every Day Smoker -- 1.0 packs/day for 30 years    Types: Cigarettes  . Smokeless tobacco: Never Used  . Alcohol Use: No     Comment: pt reports a couple of sips of champagne on New Years    Review of Systems ROS: Statement: All systems negative except as marked or noted in the HPI; Constitutional: Negative for fever and chills. ; ; Eyes: Negative for eye pain, redness and discharge. ; ; ENMT: Negative for ear pain, hoarseness, nasal congestion, sinus pressure and sore throat. ; ; Cardiovascular: Negative for chest pain, palpitations, diaphoresis, dyspnea and peripheral edema. ; ; Respiratory: Negative for cough, wheezing and stridor. ; ; Gastrointestinal: Negative for nausea, vomiting, diarrhea, abdominal pain, blood in stool, hematemesis, jaundice and rectal bleeding. . ; ; Genitourinary: Negative for dysuria, flank pain and hematuria. ; ; Musculoskeletal: Negative for back pain and neck pain. Negative for swelling and trauma.; ; Skin: +abscess. Negative  for pruritus, rash, abrasions, blisters, bruising and skin lesion.; ; Neuro: Negative for headache, lightheadedness and neck stiffness. Negative for weakness, altered level of consciousness , altered mental status, extremity weakness, paresthesias, involuntary movement, seizure and syncope.     Allergies  Ketorolac tromethamine; Sulfa drugs cross reactors; Tramadol; Penicillins; and Trazodone and nefazodone  Home Medications   Current Outpatient Rx  Name  Route  Sig  Dispense  Refill  . ASPIRIN-ACETAMINOPHEN-CAFFEINE 500-325-65 MG PO PACK   Oral   Take 1 Package by mouth as needed. For pain         . DOXYCYCLINE HYCLATE 100 MG PO TABS   Oral   Take 1 tablet (100 mg total) by mouth every 12 (twelve) hours.   14 tablet   0   . MUSCLE RUB 10-15 % EX CREA   Topical   Apply 1 application topically as needed. FOR PAIN (back pain)         . OXYCODONE-ACETAMINOPHEN 10-325 MG PO TABS   Oral   Take 1 tablet by mouth 2 (two) times daily.         Marland Kitchen OXYMORPHONE HCL ER 30 MG PO TB12   Oral   Take 30 mg by mouth every 12 (twelve) hours.         Marland Kitchen  PREGABALIN 300 MG PO CAPS   Oral   Take 300 mg by mouth 2 (two) times daily.         Marland Kitchen TIZANIDINE HCL 4 MG PO TABS   Oral   Take 4-12 mg by mouth 4 (four) times daily. 4mg  three times day and 12 mg at bedtime           BP 116/78  Pulse 74  Temp 98.1 F (36.7 C) (Oral)  Resp 18  Ht 5\' 10"  (1.778 m)  Wt 170 lb (77.111 kg)  BMI 24.39 kg/m2  SpO2 100%  Physical Exam 1510: Physical examination:  Nursing notes reviewed; Vital signs and O2 SAT reviewed;  Constitutional: Well developed, Well nourished, Well hydrated, In no acute distress; Head:  Normocephalic, atraumatic; Eyes: EOMI, PERRL, No scleral icterus; ENMT: Mouth and pharynx normal, Mucous membranes moist; Neck: Supple, Full range of motion, No lymphadenopathy; Cardiovascular: Regular rate and rhythm, No murmur, rub, or gallop; Respiratory: Breath sounds clear & equal  bilaterally, No rales, rhonchi, wheezes.  Speaking full sentences with ease, Normal respiratory effort/excursion; Chest: Nontender, Movement normal;; Extremities: Pulses normal, +approx 2-3 mm diameter small open wound left lateral AC area without spontaneous drainage, no drainage able to be manually expressed, no bleeding.  +small firm nodular areas to central and medial left AC with localized tenderness, no fluctuance, no central pointing areas.  No erythema or ecchymosis to entire AC area. No streaking. No edema.; Neuro: AA&Ox3, Major CN grossly intact.  Speech clear. No gross focal motor or sensory deficits in extremities.; Skin: Color normal, Warm, Dry.   ED Course  Procedures    MDM  MDM Reviewed: nursing note, previous chart and vitals      1320:  Pt queried regarding IV drug use, injecting himself with a needle, etc.  Denies all.  States "it was just there and busted open."  States he has a hx of "these things just popping up."  No purulence, no active drainage at this time, no bleeding. Pt endorsed to ED RN that he had an IV placed in that area approx 2 months ago but that the "swelling" started 1 month ago.  Will provide wound care, rx doxycycline.  Will have pt come back tomorrow for outpt Vasc US LUE to r/o DVT as cause for nodular areas in left AC. Pt requesting "some pain meds" with his rx for antibiotic.  [place] Controlled Substance Database accessed:  Pt filled opana ER 30mg  tabs #60 on 10/14/12.  Pt instructed he will receive a dose of pain meds while in the ED but will otherwise not receive another rx.  He was instructed to f/u with his Pain Management MD tomorrow for his chronic pain rx needs.  Verb understanding.  Further instructed to f/u with PMD within the next 24 to 48 hours for a re-check of his arm. Verb understanding.       Laray Anger, DO 11/07/12 [place] Moment

## 2012-11-06 ENCOUNTER — Ambulatory Visit (HOSPITAL_COMMUNITY)
Admit: 2012-11-06 | Discharge: 2012-11-06 | Disposition: A | Discharge status: Home / Self Care | Payer: Medicare Other | Attending: Emergency Medicine | Admitting: Emergency Medicine

## 2012-11-06 DIAGNOSIS — R609 Edema, unspecified: Secondary | ICD-10-CM

## 2012-11-06 DIAGNOSIS — I808 Phlebitis and thrombophlebitis of other sites: Secondary | ICD-10-CM | POA: Insufficient documentation

## 2012-11-15 ENCOUNTER — Emergency Department (HOSPITAL_COMMUNITY)
Admission: EM | Admit: 2012-11-15 | Discharge: 2012-11-15 | Disposition: A | Discharge status: Home / Self Care | Payer: Medicare Other | Attending: Emergency Medicine | Admitting: Emergency Medicine

## 2012-11-15 ENCOUNTER — Encounter (HOSPITAL_COMMUNITY): Payer: Self-pay | Admitting: *Deleted

## 2012-11-15 DIAGNOSIS — F1210 Cannabis abuse, uncomplicated: Secondary | ICD-10-CM | POA: Insufficient documentation

## 2012-11-15 DIAGNOSIS — R059 Cough, unspecified: Secondary | ICD-10-CM | POA: Insufficient documentation

## 2012-11-15 DIAGNOSIS — Z760 Encounter for issue of repeat prescription: Secondary | ICD-10-CM | POA: Insufficient documentation

## 2012-11-15 DIAGNOSIS — J45909 Unspecified asthma, uncomplicated: Secondary | ICD-10-CM | POA: Insufficient documentation

## 2012-11-15 DIAGNOSIS — F17200 Nicotine dependence, unspecified, uncomplicated: Secondary | ICD-10-CM | POA: Insufficient documentation

## 2012-11-15 DIAGNOSIS — R05 Cough: Secondary | ICD-10-CM | POA: Insufficient documentation

## 2012-11-15 DIAGNOSIS — Z8659 Personal history of other mental and behavioral disorders: Secondary | ICD-10-CM | POA: Insufficient documentation

## 2012-11-15 DIAGNOSIS — Z79899 Other long term (current) drug therapy: Secondary | ICD-10-CM | POA: Insufficient documentation

## 2012-11-15 DIAGNOSIS — G40909 Epilepsy, unspecified, not intractable, without status epilepticus: Secondary | ICD-10-CM | POA: Insufficient documentation

## 2012-11-15 MED ORDER — ALBUTEROL SULFATE HFA 108 (90 BASE) MCG/ACT IN AERS: 2.0000 | INHALATION_SPRAY | RESPIRATORY_TRACT | Status: AC | PRN

## 2012-11-15 NOTE — ED Provider Notes (Signed)
History     CSN: [id number]  Arrival date & time 11/15/12  1607   First MD Initiated Contact with Patient 11/15/12 1736      Chief Complaint  Patient presents with  . Medication Refill    (Consider location/radiation/quality/duration/timing/severity/associated sxs/prior treatment) HPI Comments: Isaac Jensen is a 47 y.o. Male presents for assistance with medication refill.  He has a history of asthma and has run out of his albuterol inhaler.  He denies any current wheezing or increased shortness of breath but has been coughing more since he has been cleaning up leaves in his yard.  He is currently awaiting establishment of care with Dr. Tanya Nones in [place], in the meantime,  His last use of his inhaler was several days ago.  He is a 2 ppd smoker.  He denies chest pain.     The history is provided by the patient.    Past Medical History  Diagnosis Date  . Seizures   . Chronic pain   . Pacemaker   . Syncope   . Syncope   . Depression     Past Surgical History  Procedure Date  . Back surgery   . Neck surgery   . Knee arthroscopy   . Insert / replace / remove pacemaker   . Shoulder surgery     Family History  Problem Relation Age of Onset  . Hypertension Brother   . Heart attack Brother     History  Substance Use Topics  . Smoking status: Current Every Day Smoker -- 1.0 packs/day for 30 years    Types: Cigarettes  . Smokeless tobacco: Never Used  . Alcohol Use: No     Comment: pt reports a couple of sips of champagne on New Years      Review of Systems  Constitutional: Negative for fever.  HENT: Negative for congestion, sore throat and neck pain.   Eyes: Negative.   Respiratory: Positive for cough. Negative for chest tightness, shortness of breath and wheezing.   Cardiovascular: Negative for chest pain.  Gastrointestinal: Negative for nausea and abdominal pain.  Genitourinary: Negative.   Musculoskeletal: Negative for joint swelling and arthralgias.    Skin: Negative.  Negative for rash and wound.  Neurological: Negative for dizziness, weakness, light-headedness, numbness and headaches.  Hematological: Negative.   Psychiatric/Behavioral: Negative.     Allergies  Ketorolac tromethamine; Sulfa drugs cross reactors; Tramadol; Penicillins; and Trazodone and nefazodone  Home Medications   Current Outpatient Rx  Name  Route  Sig  Dispense  Refill  . ALBUTEROL SULFATE HFA 108 (90 BASE) MCG/ACT IN AERS   Inhalation   Inhale 2 puffs into the lungs every 4 (four) hours as needed for wheezing.   1 Inhaler   0   . DOXYCYCLINE HYCLATE 100 MG PO TABS   Oral   Take 1 tablet (100 mg total) by mouth 2 (two) times daily.   14 tablet   0   . MUSCLE RUB 10-15 % EX CREA   Topical   Apply 1 application topically as needed. FOR PAIN (back pain)         . OXYCODONE-ACETAMINOPHEN 10-325 MG PO TABS   Oral   Take 1 tablet by mouth 2 (two) times daily.         Marland Kitchen OXYMORPHONE HCL ER 30 MG PO TB12   Oral   Take 30 mg by mouth every 12 (twelve) hours.         Marland Kitchen PREGABALIN 300 MG  PO CAPS   Oral   Take 300 mg by mouth 2 (two) times daily.         Marland Kitchen TIZANIDINE HCL 4 MG PO TABS   Oral   Take 4-12 mg by mouth 4 (four) times daily. 4mg  three times day and 12 mg at bedtime         . UNKNOWN TO PATIENT   Oral   Take 1 tablet by mouth 3 (three) times daily. PRESCRIPTION MEDICATION-NAME UNKNOWN: For pain           BP 108/78  Pulse 67  Temp 97.5 F (36.4 C) (Oral)  Resp 16  SpO2 100%  Physical Exam  Nursing note and vitals reviewed. Constitutional: He appears well-developed and well-nourished.  HENT:  Head: Normocephalic and atraumatic.  Eyes: Conjunctivae normal are normal.  Neck: Normal range of motion.  Cardiovascular: Normal rate, regular rhythm, normal heart sounds and intact distal pulses.   Pulmonary/Chest: Effort normal and breath sounds normal. No respiratory distress. He has no wheezes. He has no rales.  Abdominal:  Soft. Bowel sounds are normal. There is no tenderness.  Musculoskeletal: Normal range of motion.  Neurological: He is alert.  Skin: Skin is warm and dry.  Psychiatric: He has a normal mood and affect.    ED Course  Procedures (including critical care time)  Labs Reviewed - No data to display No results found.   1. Medication refill       MDM  Refill prescription for albuterol mdi given.  Encouraged smoking cessation and f/u with new pcp prn.        Burgess Amor, [place] 11/15/12 1940

## 2012-11-15 NOTE — ED Notes (Signed)
Pt denies any complaints at this time. States he just needs an albuterol inhaler, or Rx for one.

## 2012-11-16 NOTE — ED Provider Notes (Signed)
Medical screening examination/treatment/procedure(s) were performed by non-physician practitioner and as supervising physician I was immediately available for consultation/collaboration.   Shelda Jakes, MD 11/16/12 [id number]

## 2012-12-09 ENCOUNTER — Emergency Department (HOSPITAL_COMMUNITY)
Admission: EM | Admit: 2012-12-09 | Discharge: 2012-12-09 | Disposition: A | Discharge status: Home / Self Care | Payer: Medicare Other | Attending: Emergency Medicine | Admitting: Emergency Medicine

## 2012-12-09 ENCOUNTER — Encounter (HOSPITAL_COMMUNITY): Payer: Self-pay | Admitting: *Deleted

## 2012-12-09 ENCOUNTER — Emergency Department (HOSPITAL_COMMUNITY): Payer: Medicare Other

## 2012-12-09 DIAGNOSIS — S61409A Unspecified open wound of unspecified hand, initial encounter: Secondary | ICD-10-CM | POA: Insufficient documentation

## 2012-12-09 DIAGNOSIS — S61439A Puncture wound without foreign body of unspecified hand, initial encounter: Secondary | ICD-10-CM

## 2012-12-09 DIAGNOSIS — M549 Dorsalgia, unspecified: Secondary | ICD-10-CM | POA: Insufficient documentation

## 2012-12-09 DIAGNOSIS — Z8659 Personal history of other mental and behavioral disorders: Secondary | ICD-10-CM | POA: Insufficient documentation

## 2012-12-09 DIAGNOSIS — Y929 Unspecified place or not applicable: Secondary | ICD-10-CM | POA: Insufficient documentation

## 2012-12-09 DIAGNOSIS — Z23 Encounter for immunization: Secondary | ICD-10-CM | POA: Insufficient documentation

## 2012-12-09 DIAGNOSIS — L03119 Cellulitis of unspecified part of limb: Secondary | ICD-10-CM

## 2012-12-09 DIAGNOSIS — L02519 Cutaneous abscess of unspecified hand: Secondary | ICD-10-CM | POA: Insufficient documentation

## 2012-12-09 DIAGNOSIS — Z79899 Other long term (current) drug therapy: Secondary | ICD-10-CM | POA: Insufficient documentation

## 2012-12-09 DIAGNOSIS — W268XXA Contact with other sharp object(s), not elsewhere classified, initial encounter: Secondary | ICD-10-CM | POA: Insufficient documentation

## 2012-12-09 DIAGNOSIS — Y939 Activity, unspecified: Secondary | ICD-10-CM | POA: Insufficient documentation

## 2012-12-09 DIAGNOSIS — G8929 Other chronic pain: Secondary | ICD-10-CM | POA: Insufficient documentation

## 2012-12-09 DIAGNOSIS — Z8669 Personal history of other diseases of the nervous system and sense organs: Secondary | ICD-10-CM | POA: Insufficient documentation

## 2012-12-09 DIAGNOSIS — F17200 Nicotine dependence, unspecified, uncomplicated: Secondary | ICD-10-CM | POA: Insufficient documentation

## 2012-12-09 DIAGNOSIS — Z9889 Other specified postprocedural states: Secondary | ICD-10-CM | POA: Insufficient documentation

## 2012-12-09 DIAGNOSIS — G40909 Epilepsy, unspecified, not intractable, without status epilepticus: Secondary | ICD-10-CM | POA: Insufficient documentation

## 2012-12-09 LAB — BASIC METABOLIC PANEL
Chloride: 103 mEq/L (ref 96–112)
Creatinine, Ser: 0.8 mg/dL (ref 0.50–1.35)
GFR calc Af Amer: >90 mL/min (ref >90)
Potassium: 3.9 mEq/L (ref 3.5–5.1)
Sodium: 140 mEq/L (ref 135–145)

## 2012-12-09 LAB — CBC WITH DIFFERENTIAL/PLATELET
Basophils Absolute: 0 10*3/uL (ref 0.0–0.1)
Basophils Relative: 0 % (ref 0–1)
MCHC: 33.2 g/dL (ref 30.0–36.0)
Neutro Abs: 4.5 10*3/uL (ref 1.7–7.7)
Neutrophils Relative %: 69 % (ref 43–77)
Platelets: 480 10*3/uL — ABNORMAL HIGH (ref 150–400)
RDW: 14.1 % (ref 11.5–15.5)
WBC: 6.6 10*3/uL (ref 4.0–10.5)

## 2012-12-09 MED ORDER — DOXYCYCLINE HYCLATE 100 MG PO CAPS: 100.0000 mg | ORAL_CAPSULE | Freq: Two times a day (BID) | ORAL | Status: DC

## 2012-12-09 MED ORDER — VANCOMYCIN HCL IN DEXTROSE 1-5 GM/200ML-% IV SOLN
1000.0000 mg | Freq: Once | INTRAVENOUS | Status: AC
  Administered 2012-12-09: 1000 mg via INTRAVENOUS
  Filled 2012-12-09: qty 200

## 2012-12-09 MED ORDER — TETANUS-DIPHTH-ACELL PERTUSSIS 5-2.5-18.5 LF-MCG/0.5 IM SUSP
0.5000 mL | Freq: Once | INTRAMUSCULAR | Status: AC
  Administered 2012-12-09: 0.5 mL via INTRAMUSCULAR
  Filled 2012-12-09: qty 0.5

## 2012-12-09 MED ORDER — HYDROMORPHONE HCL PF 1 MG/ML IJ SOLN
1.0000 mg | Freq: Once | INTRAMUSCULAR | Status: AC
  Administered 2012-12-09: 1 mg via INTRAVENOUS
  Filled 2012-12-09: qty 1

## 2012-12-09 MED ORDER — BACITRACIN ZINC 500 UNIT/GM EX OINT
TOPICAL_OINTMENT | CUTANEOUS | Status: AC
  Filled 2012-12-09: qty 0.9

## 2012-12-09 MED ORDER — HYDROCODONE-ACETAMINOPHEN 5-325 MG PO TABS: 1.0000 | ORAL_TABLET | Freq: Four times a day (QID) | ORAL | Status: AC | PRN

## 2012-12-09 MED ORDER — SODIUM CHLORIDE 0.9 % IV SOLN
INTRAVENOUS | Status: DC
  Administered 2012-12-09: 19:00:00 via INTRAVENOUS

## 2012-12-09 NOTE — ED Provider Notes (Signed)
History   This chart was scribed for Isaac Jakes, MD by Leone Payor, ED Scribe. This patient was seen in room [facility]17[facility]17 and the patient's care was started 5:14 PM .   CSN: [id number]  Arrival date & time 12/09/12  1656   First MD Initiated Contact with Patient 12/09/12 1709      Chief Complaint  Patient presents with  . Cellulitis     The history is provided by the patient. No language interpreter was used.    BLADIMIR Jensen is a 47 y.o. male who presents to the Emergency Department complaining of cellulitis to the left hand after a puncture wound to the same area by a rusty nail 4 days ago. Pt reports moving scraps and did not notice the nail's presence. Pt reports swelling and drainage. Tetanus is not UTD. Pt is left handed.    Pt has h/o seizures, pacemaker, chronic pain, syncope. Pt has h/o back surgery, neck surgery, shoulder surgery.   Pt is a current everyday smoker but denies alcohol use.  No PCP.  Past Medical History  Diagnosis Date  . Seizures   . Chronic pain   . Pacemaker   . Syncope   . Syncope   . Depression     Past Surgical History  Procedure Date  . Back surgery   . Neck surgery   . Knee arthroscopy   . Insert / replace / remove pacemaker   . Shoulder surgery     Family History  Problem Relation Age of Onset  . Hypertension Brother   . Heart attack Brother     History  Substance Use Topics  . Smoking status: Current Every Day Smoker -- 1.0 packs/day for 30 years    Types: Cigarettes  . Smokeless tobacco: Never Used  . Alcohol Use: No     Comment: pt reports a couple of sips of champagne on New Years      Review of Systems  Constitutional: Negative for fever.  HENT: Negative for congestion, sore throat, rhinorrhea and neck pain.   Eyes: Negative for visual disturbance.  Respiratory: Negative for cough and shortness of breath.   Cardiovascular: Negative for chest pain.  Gastrointestinal: Negative for nausea, vomiting,  abdominal pain and diarrhea.  Genitourinary: Negative for dysuria and hematuria.  Musculoskeletal: Positive for back pain.  Skin: Negative for rash.  Neurological: Negative for headaches.  Hematological: Does not bruise/bleed easily.  All other systems reviewed and are negative.    Allergies  Ketorolac tromethamine; Sulfa drugs cross reactors; Tramadol; Penicillins; and Trazodone and nefazodone  Home Medications   Current Outpatient Rx  Name  Route  Sig  Dispense  Refill  . MUSCLE RUB 10-15 % EX CREA   Topical   Apply 1 application topically as needed. FOR PAIN (back pain)         . OXYCODONE-ACETAMINOPHEN 10-325 MG PO TABS   Oral   Take 1 tablet by mouth 2 (two) times daily.         Marland Kitchen OXYMORPHONE HCL ER 30 MG PO TB12   Oral   Take 30 mg by mouth every 12 (twelve) hours.         Marland Kitchen PREGABALIN 300 MG PO CAPS   Oral   Take 300 mg by mouth 2 (two) times daily.         Marland Kitchen TIZANIDINE HCL 4 MG PO TABS   Oral   Take 4-12 mg by mouth 4 (four) times daily. 4mg  three times  day and 12 mg at bedtime         . ALBUTEROL SULFATE HFA 108 (90 BASE) MCG/ACT IN AERS   Inhalation   Inhale 2 puffs into the lungs every 4 (four) hours as needed for wheezing.   1 Inhaler   0     BP 120/84  Pulse 69  Temp 97.3 F (36.3 C) (Oral)  Ht 5\' 10"  (1.778 m)  Wt 170 lb (77.111 kg)  BMI 24.39 kg/m2  SpO2 98%  Physical Exam  Nursing note and vitals reviewed. Constitutional: He is oriented to person, place, and time. He appears well-developed and well-nourished. No distress.  HENT:  Head: Normocephalic and atraumatic.  Eyes: EOM are normal.  Neck: Neck supple. No tracheal deviation present.  Cardiovascular: Normal rate, regular rhythm and normal heart sounds.   No murmur heard.      Left radial pulse is 2+. Cap refill 1 sec.  Pulmonary/Chest: Effort normal and breath sounds normal. No respiratory distress. He has no wheezes. He has no rales.  Abdominal: Soft. Bowel sounds are  normal. There is no tenderness.  Musculoskeletal: Normal range of motion. He exhibits edema and tenderness.       Left hand has 3mm puncture wound with about 7cm swelling to the dorsum of hand. Redness present with purulent discharge. Area of fluctuance underneath it.   No elbow, axillary, or clavicular lymphadenopathy.   Lymphadenopathy:    He has no cervical adenopathy.    He has no axillary adenopathy.  Neurological: He is alert and oriented to person, place, and time. No cranial nerve deficit. He exhibits normal muscle tone. Coordination normal.  Skin: Skin is warm and dry.  Psychiatric: He has a normal mood and affect. His behavior is normal.    ED Course  Procedures (including critical care time)  DIAGNOSTIC STUDIES: Oxygen Saturation is 98% on room air, normal by my interpretation.    COORDINATION OF CARE:  5:33 PM Discussed treatment plan which includes imaging of left hand, wound culture, CBC panel, basic metabolic panel  with pt at bedside and pt agreed to plan.    Labs Reviewed  CBC WITH DIFFERENTIAL - Abnormal; Notable for the following:    Hemoglobin 12.9 (*)     HCT 38.9 (*)     Platelets 480 (*)     All other components within normal limits  BASIC METABOLIC PANEL  WOUND CULTURE   Dg Hand Complete Left  12/09/2012  *RADIOLOGY REPORT*  Clinical Data: Puncture wound to left hand with pain, swelling and redness.  LEFT HAND - COMPLETE 3+ VIEW  Comparison: None  Findings: A tiny metallic foreign body in the dorsal soft tissues is identified at the level of the fourth MCP joint. Dorsal soft tissue swelling is present. There is no evidence of fracture, subluxation, dislocation or radiographic evidence of acute osteomyelitis. No focal bony lesions are present.  IMPRESSION: Tiny metallic foreign body in the dorsal soft tissues with soft tissue swelling.  No bony abnormalities identified.   Original Report Authenticated By: Harmon Pier, M.D.    Results for orders placed during  the hospital encounter of 12/09/12  CBC WITH DIFFERENTIAL      Component Value Range   WBC 6.6  4.0 - 10.5 K/uL   RBC 4.24  4.22 - 5.81 MIL/uL   Hemoglobin 12.9 (*) 13.0 - 17.0 g/dL   HCT 11.9 (*) 14.7 - 82.9 %   MCV 91.7  78.0 - 100.0 fL   MCH 30.4  26.0 - 34.0 pg   MCHC 33.2  30.0 - 36.0 g/dL   RDW 16.1  09.6 - 04.5 %   Platelets 480 (*) 150 - 400 K/uL   Neutrophils Relative 69  43 - 77 %   Neutro Abs 4.5  1.7 - 7.7 K/uL   Lymphocytes Relative 24  12 - 46 %   Lymphs Abs 1.6  0.7 - 4.0 K/uL   Monocytes Relative 6  3 - 12 %   Monocytes Absolute 0.4  0.1 - 1.0 K/uL   Eosinophils Relative 1  0 - 5 %   Eosinophils Absolute 0.1  0.0 - 0.7 K/uL   Basophils Relative 0  0 - 1 %   Basophils Absolute 0.0  0.0 - 0.1 K/uL  BASIC METABOLIC PANEL      Component Value Range   Sodium 140  135 - 145 mEq/L   Potassium 3.9  3.5 - 5.1 mEq/L   Chloride 103  96 - 112 mEq/L   CO2 25  19 - 32 mEq/L   Glucose, Bld 72  70 - 99 mg/dL   BUN 16  6 - 23 mg/dL   Creatinine, Ser 4.09  0.50 - 1.35 mg/dL   Calcium 9.8  8.4 - 81.1 mg/dL   GFR calc non Af Amer >90  >90 mL/min   GFR calc Af Amer >90  >90 mL/min     1. Cellulitis of hand   2. Puncture wound of hand       MDM   Patient status post punctured to the back of his left hand which is his dominant hand about 4 days ago. Now has a cellulitis and purulent drainage from the puncture site. No evidence of deep space infection patient's care good range of motion of fingers without significant pain. Sensation intact capillary refill is good to the distal fingertips radial pulses strong 2+. X-ray shows a very very tiny metallic foreign body in the area of the wound. It's easy to express pus from the wound area of fluctuance is probably 1-2 cm surrounding tissue probably 1 cm surrounding the puncture site. Culture taken patient given one dose of IV vancomycin in the ED. The foreign body does not require movement removal it is very small. Patient will be  started on doxycycline and will return tomorrow to ED if the hand is any worse otherwise to return in on Thursday for recheck. Patient will also elevate the hand to soak it in warm water for 20 minutes twice a day. Clinically suspect is excellent chance of this clearing up.     I personally performed the services described in this documentation, which was scribed in my presence. The recorded information has been reviewed and is accurate.    Isaac Jakes, MD 12/09/12 2023

## 2012-12-09 NOTE — ED Notes (Signed)
Wound culture sent over. Labs obtained. Tetanus administered. EDP aware of unsuccessful attempts for IV x 3 nurses. Next shift to attempt IV. NAD. Pt ambulated to restroom.

## 2012-12-09 NOTE — ED Notes (Signed)
Assumed care of patient at this time.  Patient sitting up in bed.  Patient needs IV placement; has been attempted x 5 without success by previous shift.  I looked for IV placement site, but unable to ascertain site.  Merilyn Baba, RN at bedside to attempt IV.

## 2012-12-09 NOTE — ED Notes (Signed)
Attempted IV x 2 with no success. 2nd nurse at bedside.

## 2012-12-09 NOTE — ED Notes (Signed)
Puncture to lt hand by rusty nail, 4 days ago, swelling and drainage present.

## 2012-12-09 NOTE — ED Notes (Signed)
Discharge instructions given and reviewed with patient.  Prescriptions given for Vicodin and Doxycycline; effects and use explained.  Patient verbalized understanding to take all antibiotic, sedating effects of Vicodin and to return to ER in 2 days for wound check.  Patient ambulatory; discharged home in good condition.  Son accompanied discharge to drive home.

## 2012-12-12 ENCOUNTER — Encounter (HOSPITAL_COMMUNITY): Payer: Self-pay | Admitting: *Deleted

## 2012-12-12 ENCOUNTER — Emergency Department (HOSPITAL_COMMUNITY)
Admission: EM | Admit: 2012-12-12 | Discharge: 2012-12-12 | Disposition: A | Discharge status: Home / Self Care | Payer: Medicare Other | Attending: Emergency Medicine | Admitting: Emergency Medicine

## 2012-12-12 DIAGNOSIS — Z950 Presence of cardiac pacemaker: Secondary | ICD-10-CM | POA: Insufficient documentation

## 2012-12-12 DIAGNOSIS — G8929 Other chronic pain: Secondary | ICD-10-CM | POA: Insufficient documentation

## 2012-12-12 DIAGNOSIS — R002 Palpitations: Secondary | ICD-10-CM | POA: Insufficient documentation

## 2012-12-12 DIAGNOSIS — Z79899 Other long term (current) drug therapy: Secondary | ICD-10-CM | POA: Insufficient documentation

## 2012-12-12 DIAGNOSIS — L02519 Cutaneous abscess of unspecified hand: Secondary | ICD-10-CM | POA: Insufficient documentation

## 2012-12-12 DIAGNOSIS — G40909 Epilepsy, unspecified, not intractable, without status epilepticus: Secondary | ICD-10-CM | POA: Insufficient documentation

## 2012-12-12 DIAGNOSIS — L03119 Cellulitis of unspecified part of limb: Secondary | ICD-10-CM | POA: Insufficient documentation

## 2012-12-12 DIAGNOSIS — IMO0002 Reserved for concepts with insufficient information to code with codable children: Secondary | ICD-10-CM

## 2012-12-12 DIAGNOSIS — R55 Syncope and collapse: Secondary | ICD-10-CM | POA: Insufficient documentation

## 2012-12-12 DIAGNOSIS — F3289 Other specified depressive episodes: Secondary | ICD-10-CM | POA: Insufficient documentation

## 2012-12-12 DIAGNOSIS — F17200 Nicotine dependence, unspecified, uncomplicated: Secondary | ICD-10-CM | POA: Insufficient documentation

## 2012-12-12 DIAGNOSIS — F329 Major depressive disorder, single episode, unspecified: Secondary | ICD-10-CM | POA: Insufficient documentation

## 2012-12-12 LAB — WOUND CULTURE

## 2012-12-12 MED ORDER — CEFTRIAXONE SODIUM 1 G IJ SOLR
1.0000 g | Freq: Once | INTRAMUSCULAR | Status: AC
  Administered 2012-12-12: 1 g via INTRAMUSCULAR
  Filled 2012-12-12: qty 10

## 2012-12-12 MED ORDER — HYDROCODONE-ACETAMINOPHEN 5-325 MG PO TABS
2.0000 | ORAL_TABLET | Freq: Once | ORAL | Status: AC
  Administered 2012-12-12: 2 via ORAL
  Filled 2012-12-12: qty 2

## 2012-12-12 MED ORDER — PROMETHAZINE HCL 12.5 MG PO TABS
12.5000 mg | ORAL_TABLET | Freq: Once | ORAL | Status: AC
  Administered 2012-12-12: 12.5 mg via ORAL
  Filled 2012-12-12: qty 1

## 2012-12-12 MED ORDER — IBUPROFEN 800 MG PO TABS
800.0000 mg | ORAL_TABLET | Freq: Once | ORAL | Status: AC
  Administered 2012-12-12: 800 mg via ORAL
  Filled 2012-12-12: qty 1

## 2012-12-12 MED ORDER — OXYCODONE-ACETAMINOPHEN 5-325 MG PO TABS: 1.0000 | ORAL_TABLET | Freq: Once | ORAL | Status: DC

## 2012-12-12 MED ORDER — HYDROCODONE-ACETAMINOPHEN 7.5-500 MG PO TABS: 1.0000 | ORAL_TABLET | ORAL | Status: DC | PRN

## 2012-12-12 NOTE — ED Notes (Signed)
Pt alert & oriented x4, stable gait. Patient  given discharge instructions, paperwork & prescription(s). Patient verbalized understanding. Pt left department w/ no further questions. 

## 2012-12-12 NOTE — ED Provider Notes (Signed)
History     CSN: [id number]  Arrival date & time 12/12/12  2232   First MD Initiated Contact with Patient 12/12/12 2244      Chief Complaint  Patient presents with  . Hand Pain  . Wound Check    (Consider location/radiation/quality/duration/timing/severity/associated sxs/prior treatment) Patient is a 47 y.o. male presenting with hand pain and wound check. The history is provided by the patient.  Hand Pain This is a new problem. The current episode started in the past 7 days. The problem occurs constantly. The problem has been gradually improving. Pertinent negatives include no abdominal pain, anorexia, arthralgias, chest pain, coughing, fever, neck pain or numbness. Nothing aggravates the symptoms. Treatments tried: antibiotics and narcotic pain medication. The treatment provided mild relief.  Wound Check     Past Medical History  Diagnosis Date  . Seizures   . Chronic pain   . Pacemaker   . Syncope   . Syncope   . Depression     Past Surgical History  Procedure Date  . Back surgery   . Neck surgery   . Knee arthroscopy   . Insert / replace / remove pacemaker   . Shoulder surgery     Family History  Problem Relation Age of Onset  . Hypertension Brother   . Heart attack Brother     History  Substance Use Topics  . Smoking status: Current Every Day Smoker -- 1.0 packs/day for 30 years    Types: Cigarettes  . Smokeless tobacco: Never Used  . Alcohol Use: No     Comment: pt reports a couple of sips of champagne on New Years      Review of Systems  Constitutional: Negative for fever and activity change.       All ROS Neg except as noted in HPI  HENT: Negative for nosebleeds and neck pain.   Eyes: Negative for photophobia and discharge.  Respiratory: Negative for cough, shortness of breath and wheezing.   Cardiovascular: Positive for palpitations. Negative for chest pain.  Gastrointestinal: Negative for abdominal pain, blood in stool and anorexia.   Genitourinary: Negative for dysuria, frequency and hematuria.  Musculoskeletal: Negative for back pain and arthralgias.  Skin: Positive for wound.  Neurological: Positive for seizures and syncope. Negative for dizziness, speech difficulty and numbness.  Psychiatric/Behavioral: Negative for hallucinations and confusion.       Depression    Allergies  Ketorolac tromethamine; Sulfa drugs cross reactors; Tramadol; Penicillins; and Trazodone and nefazodone  Home Medications   Current Outpatient Rx  Name  Route  Sig  Dispense  Refill  . ALBUTEROL SULFATE HFA 108 (90 BASE) MCG/ACT IN AERS   Inhalation   Inhale 2 puffs into the lungs every 4 (four) hours as needed for wheezing.   1 Inhaler   0   . DOXYCYCLINE HYCLATE 100 MG PO CAPS   Oral   Take 1 capsule (100 mg total) by mouth 2 (two) times daily.   14 capsule   0   . HYDROCODONE-ACETAMINOPHEN 5-325 MG PO TABS   Oral   Take 1-2 tablets by mouth every 6 (six) hours as needed for pain.   14 tablet   0   . MUSCLE RUB 10-15 % EX CREA   Topical   Apply 1 application topically as needed. FOR PAIN (back pain)         . OXYCODONE-ACETAMINOPHEN 10-325 MG PO TABS   Oral   Take 1 tablet by mouth 2 (two) times daily.         Marland Kitchen  OXYMORPHONE HCL ER 30 MG PO TB12   Oral   Take 30 mg by mouth every 12 (twelve) hours.         Marland Kitchen PREGABALIN 300 MG PO CAPS   Oral   Take 300 mg by mouth 2 (two) times daily.         Marland Kitchen TIZANIDINE HCL 4 MG PO TABS   Oral   Take 4-12 mg by mouth 4 (four) times daily. 4mg  three times day and 12 mg at bedtime           BP 103/67  Pulse 65  Temp 98.2 F (36.8 C) (Oral)  Resp 20  Ht 5\' 10"  (1.778 m)  Wt 170 lb (77.111 kg)  BMI 24.39 kg/m2  SpO2 100%  Physical Exam  Nursing note and vitals reviewed. Constitutional: He is oriented to person, place, and time. He appears well-developed and well-nourished.  Non-toxic appearance.  HENT:  Head: Normocephalic.  Right Ear: Tympanic membrane and  external ear normal.  Left Ear: Tympanic membrane and external ear normal.  Eyes: EOM and lids are normal. Pupils are equal, round, and reactive to light.  Neck: Normal range of motion. Neck supple. Carotid bruit is not present.  Cardiovascular: Normal rate, regular rhythm, normal heart sounds, intact distal pulses and normal pulses.   Pulmonary/Chest: Breath sounds normal. No respiratory distress.  Abdominal: Soft. Bowel sounds are normal. There is no tenderness. There is no guarding.  Musculoskeletal: Normal range of motion.       The puncture wound and cellulitis of the left hand seem to be progressing nicely. The redness has almost completely resolved. Mod swelling still present. Good ROM of the fingers of the left hand, but pt can not make a fist. Good cap refill noted. No palpable nodes of the axilla. Dorsum of the hand still tender to palpation.  Lymphadenopathy:       Head (right side): No submandibular adenopathy present.       Head (left side): No submandibular adenopathy present.    He has no cervical adenopathy.  Neurological: He is alert and oriented to person, place, and time. He has normal strength. No cranial nerve deficit or sensory deficit.  Skin: Skin is warm and dry.  Psychiatric: He has a normal mood and affect. His speech is normal.    ED Course  Procedures (including critical care time)  Labs Reviewed - No data to display No results found.   No diagnosis found.    MDM  I have reviewed nursing notes, vital signs, and all appropriate lab and imaging results for this patient. The wound to the left hand is progressing nicely. Cellulitis resolving. Vital signs wnl. Plan - Pt given rocephin in ED. Pt continues to have pain. Rx for norco 7.5 given to patient. Pt to continue warm soaks and doxycycline.       Kathie Dike, [place] 12/12/12 2342

## 2012-12-12 NOTE — ED Notes (Signed)
Here for recheck of infected lt hand .  Seen here 1/28

## 2012-12-13 NOTE — ED Notes (Signed)
+  Wound. Patient given Doxycyline. No sensitivity listed. Chart sent to EDP office for review.

## 2012-12-14 NOTE — ED Provider Notes (Signed)
Medical screening examination/treatment/procedure(s) were performed by non-physician practitioner and as supervising physician I was immediately available for consultation/collaboration.   Kayleena Eke M Prudie Guthridge, DO 12/14/12 1925 

## 2012-12-16 NOTE — ED Notes (Signed)
Chart returned from EDP office . Recommend continue with current therapy , Follow up for wound recheck as previously recommend per Fayrene Helper

## 2012-12-17 ENCOUNTER — Telehealth (HOSPITAL_COMMUNITY): Payer: Self-pay | Admitting: *Deleted

## 2013-02-07 ENCOUNTER — Encounter (HOSPITAL_COMMUNITY): Payer: Self-pay | Admitting: Emergency Medicine

## 2013-02-07 ENCOUNTER — Emergency Department (HOSPITAL_COMMUNITY)
Admission: EM | Admit: 2013-02-07 | Discharge: 2013-02-07 | Disposition: A | Discharge status: Home / Self Care | Payer: Medicare Other | Attending: Emergency Medicine | Admitting: Emergency Medicine

## 2013-02-07 DIAGNOSIS — G40909 Epilepsy, unspecified, not intractable, without status epilepticus: Secondary | ICD-10-CM | POA: Insufficient documentation

## 2013-02-07 DIAGNOSIS — E876 Hypokalemia: Secondary | ICD-10-CM

## 2013-02-07 DIAGNOSIS — F329 Major depressive disorder, single episode, unspecified: Secondary | ICD-10-CM

## 2013-02-07 DIAGNOSIS — R45851 Suicidal ideations: Secondary | ICD-10-CM | POA: Insufficient documentation

## 2013-02-07 DIAGNOSIS — Z8669 Personal history of other diseases of the nervous system and sense organs: Secondary | ICD-10-CM | POA: Insufficient documentation

## 2013-02-07 DIAGNOSIS — G8929 Other chronic pain: Secondary | ICD-10-CM | POA: Insufficient documentation

## 2013-02-07 DIAGNOSIS — Z79899 Other long term (current) drug therapy: Secondary | ICD-10-CM | POA: Insufficient documentation

## 2013-02-07 DIAGNOSIS — F17200 Nicotine dependence, unspecified, uncomplicated: Secondary | ICD-10-CM | POA: Insufficient documentation

## 2013-02-07 LAB — CBC WITH DIFFERENTIAL/PLATELET
Eosinophils Absolute: 0 10*3/uL (ref 0.0–0.7)
Hemoglobin: 12.1 g/dL — ABNORMAL LOW (ref 13.0–17.0)
Lymphs Abs: 1.8 10*3/uL (ref 0.7–4.0)
MCH: 29.8 pg (ref 26.0–34.0)
MCV: 88.9 fL (ref 78.0–100.0)
Monocytes Relative: 6 % (ref 3–12)
Neutrophils Relative %: 85 % — ABNORMAL HIGH (ref 43–77)
RBC: 4.06 MIL/uL — ABNORMAL LOW (ref 4.22–5.81)

## 2013-02-07 LAB — RAPID URINE DRUG SCREEN, HOSP PERFORMED
Amphetamines: NOT DETECTED
Barbiturates: NOT DETECTED
Benzodiazepines: NOT DETECTED
Cocaine: NOT DETECTED
Tetrahydrocannabinol: POSITIVE — AB

## 2013-02-07 LAB — BASIC METABOLIC PANEL
BUN: 12 mg/dL (ref 6–23)
CO2: 26 mEq/L (ref 19–32)
Calcium: 9.9 mg/dL (ref 8.4–10.5)
Chloride: 103 mEq/L (ref 96–112)
Creatinine, Ser: 0.76 mg/dL (ref 0.50–1.35)
GFR calc Af Amer: >90 mL/min (ref >90)
GFR calc non Af Amer: >90 mL/min (ref >90)
GFR calc non Af Amer: >90 mL/min (ref >90)
Glucose, Bld: 134 mg/dL — ABNORMAL HIGH (ref 70–99)
Potassium: 2.9 mEq/L — ABNORMAL LOW (ref 3.5–5.1)

## 2013-02-07 MED ORDER — LORAZEPAM 1 MG PO TABS: 1.0000 mg | ORAL_TABLET | Freq: Three times a day (TID) | ORAL | Status: DC | PRN

## 2013-02-07 MED ORDER — RISPERIDONE 3 MG PO TABS
3.0000 mg | ORAL_TABLET | Freq: Every day | ORAL | Status: DC
  Filled 2013-02-07: qty 1

## 2013-02-07 MED ORDER — ACETAMINOPHEN 325 MG PO TABS: 650.0000 mg | ORAL_TABLET | ORAL | Status: DC | PRN

## 2013-02-07 MED ORDER — PREGABALIN 75 MG PO CAPS
300.0000 mg | ORAL_CAPSULE | Freq: Two times a day (BID) | ORAL | Status: DC
  Administered 2013-02-07 (×2): 300 mg via ORAL
  Filled 2013-02-07 (×2): qty 4

## 2013-02-07 MED ORDER — DULOXETINE HCL 60 MG PO CPEP
60.0000 mg | ORAL_CAPSULE | Freq: Every day | ORAL | Status: DC
  Filled 2013-02-07 (×2): qty 1

## 2013-02-07 MED ORDER — POTASSIUM CHLORIDE CRYS ER 20 MEQ PO TBCR
60.0000 meq | EXTENDED_RELEASE_TABLET | Freq: Once | ORAL | Status: AC
  Administered 2013-02-07: 60 meq via ORAL
  Filled 2013-02-07: qty 3

## 2013-02-07 MED ORDER — ONDANSETRON HCL 4 MG PO TABS: 4.0000 mg | ORAL_TABLET | Freq: Three times a day (TID) | ORAL | Status: DC | PRN

## 2013-02-07 MED ORDER — POTASSIUM CHLORIDE CRYS ER 20 MEQ PO TBCR
40.0000 meq | EXTENDED_RELEASE_TABLET | Freq: Once | ORAL | Status: AC
  Administered 2013-02-07: 40 meq via ORAL
  Filled 2013-02-07: qty 2

## 2013-02-07 NOTE — ED Notes (Signed)
Results faxed to [facility], Potassium is now 3.2

## 2013-02-07 NOTE — BH Assessment (Signed)
[facility] Assessment Progress Note      02/07/2013 Patient was referred to [facility]. If accepted, he can be transferred as his IVC paperwork is complete and he will not require an authorization.   Shon Baton, MSW, LCSW, LCASA, CSW-G

## 2013-02-07 NOTE — ED Notes (Signed)
ACT in room assessing pt at this time

## 2013-02-07 NOTE — ED Notes (Signed)
Pt transferred to [facility], belongings returned and verified with pt, officer here to transport pt

## 2013-02-07 NOTE — ED Notes (Signed)
[facility] states pt accepted as long as potassium above 3.2. Will order repeat BMP. State to fax results to [phone]

## 2013-02-07 NOTE — ED Notes (Signed)
Pt states many recent changes, states his life is falling apart, recently lost mother, now having SI with thoughts and plan to hang himself. Denies HI or hallucinations. States these feelings x1 week.

## 2013-02-07 NOTE — ED Notes (Signed)
telepsych in room at this time

## 2013-02-07 NOTE — ED Notes (Signed)
Pt accepted at [facility], [phone] for report

## 2013-02-07 NOTE — BH Assessment (Signed)
Assessment Note   Isaac Jensen is an 47 y.o. male. Presents in the ED with suicidal ideation, stating he is having thoughts of hanging himself; he apparently he has attempted to slice his wrist/upper arm (has prominent scars); and has attempted to hang himself as well in the past. He denies having any homicidal ideation at this time. He does not present with any delusions or hallucinations. He denies any substance abuse. He is very flat, stating he just doesn't have any will to live. He states he has been very sad, especially since his mother and aunt died; they died very close together. He is pleasant, cooperative and very quiet. He states he needs "long term inpatient services." He reports a very poor appetite, believes he has lost at least 10 lbs. He states that he will sleep for about 14 hours, but then will stay up for 2 days. He reports he is taking his respiradal and cymbalta, but those do not appear to be working as well for him now.   Axis I: Major Depression, Recurrent severe Axis II: Deferred Axis III: See medical history Axis IV: social isolation; limited financial means,  Axis V: GAF 24  Past Medical History:  Past Medical History  Diagnosis Date  . Seizures   . Chronic pain   . Pacemaker   . Syncope   . Syncope   . Depression     Past Surgical History  Procedure Laterality Date  . Back surgery    . Neck surgery    . Knee arthroscopy    . Insert / replace / remove pacemaker    . Shoulder surgery      Family History:  Family History  Problem Relation Age of Onset  . Hypertension Brother   . Heart attack Brother     Social History:  reports that he has been smoking Cigarettes.  He has a 30 pack-year smoking history. He has never used smokeless tobacco. He reports that he uses illicit drugs (Marijuana). He reports that he does not drink alcohol.  Additional Social History:     CIWA: CIWA-Ar BP: 131/85 mmHg Pulse Rate: 82 COWS:    Allergies:  Allergies   Allergen Reactions  . Ketorolac Tromethamine Shortness Of Breath  . Sulfa Drugs Cross Reactors Shortness Of Breath  . Tramadol Shortness Of Breath  . Penicillins Other (See Comments)    vomiting  . Trazodone And Nefazodone Other (See Comments)    REACTION: ALTERS MENTAL STATUS    Home Medications:  (Not in a hospital admission)  OB/GYN Status:  No LMP for male patient.  General Assessment Data Location of Assessment: [facility] ACT Assessment: Yes Living Arrangements: Alone Can pt return to current living arrangement?: Yes Admission Status: Involuntary Is patient capable of signing voluntary admission?: Yes Transfer from: Acute Hospital Referral Source: MD  Education Status Is patient currently in school?: No  Risk to self Suicidal Ideation: Yes-Currently Present Suicidal Intent: Yes-Currently Present Is patient at risk for suicide?: Yes Suicidal Plan?: Yes-Currently Present Specify Current Suicidal Plan: plans to hang himself Access to Means: Yes Specify Access to Suicidal Means: rope What has been your use of drugs/alcohol within the last 12 months?: denies Previous Attempts/Gestures: Yes How many times?: 2 Other Self Harm Risks: no Triggers for Past Attempts: Anniversary Intentional Self Injurious Behavior: Cutting Comment - Self Injurious Behavior: old scars on arm Family Suicide History: No Recent stressful life event(s): Financial Problems Persecutory voices/beliefs?: No Depression: Yes Depression Symptoms: Insomnia;Loss of interest  in usual pleasures Substance abuse history and/or treatment for substance abuse?: No Suicide prevention information given to non-admitted patients: Not applicable  Risk to Others Homicidal Ideation: No Thoughts of Harm to Others: No Current Homicidal Intent: No Current Homicidal Plan: No Access to Homicidal Means: No History of harm to others?: No Assessment of Violence: None Noted Does patient have access to weapons?:  No Criminal Charges Pending?: No Does patient have a court date: No  Psychosis Hallucinations: None noted Delusions: None noted  Mental Status Report Appear/Hygiene: Disheveled Eye Contact: Poor Motor Activity: Freedom of movement;Restlessness Speech: Pressured;Soft;Slow Level of Consciousness: Alert;Quiet/awake;Restless Mood: Depressed;Anxious;Helpless;Preoccupied Affect: Anxious;Appropriate to circumstance;Blunted;Depressed Anxiety Level: Minimal Thought Processes: Coherent Judgement: Impaired Orientation: Person;Place;Time;Situation;Appropriate for developmental age Obsessive Compulsive Thoughts/Behaviors: Minimal  Cognitive Functioning Concentration: Decreased Memory: Recent Intact IQ: Average Insight: Poor Impulse Control: Poor Appetite: Fair Weight Loss:  (10 lbs) Sleep: Decreased Total Hours of Sleep: 4  ADLScreening [facility] Assessment Services) Patient's cognitive ability adequate to safely complete daily activities?: Yes Patient able to express need for assistance with ADLs?: Yes Independently performs ADLs?: Yes (appropriate for developmental age)  Abuse/Neglect [facility]) Physical Abuse: Denies Verbal Abuse: Denies Sexual Abuse: Denies  Prior Inpatient Therapy Prior Inpatient Therapy: Yes Prior Therapy Dates:  (2013, 2011) Prior Therapy Facilty/Provider(s):  ([facility]) Reason for Treatment:  (depression, si)  Prior Outpatient Therapy Prior Outpatient Therapy: Yes Prior Therapy Dates:  (current) Prior Therapy Facilty/Provider(s):  ([facility]) Reason for Treatment:  (depression)  ADL Screening (condition at time of admission) Patient's cognitive ability adequate to safely complete daily activities?: Yes Patient able to express need for assistance with ADLs?: Yes Independently performs ADLs?: Yes (appropriate for developmental age)       Abuse/Neglect Assessment (Assessment to be complete while patient is alone) Physical Abuse:  Denies Verbal Abuse: Denies Sexual Abuse: Denies Values / Beliefs Cultural Requests During Hospitalization: None Spiritual Requests During Hospitalization: None        Additional Information 1:1 In Past 12 Months?: No CIRT Risk: No Elopement Risk: No Does patient have medical clearance?: Yes     Disposition:  Disposition Initial Assessment Completed for this Encounter: Yes Disposition of Patient: Inpatient treatment program Type of inpatient treatment program: Adult  On Site Evaluation by:  Dr. Colon Branch Reviewed with Physician:  Dr. Colon Branch  Referrals will be made to [facility], [facility], [facility]  Jacqulyn Ducking, [place] H 02/07/2013 3:11 AM

## 2013-02-07 NOTE — ED Notes (Signed)
Pt states he has been having a lot of si thoughts and his plan was to hang himself.Pt states his life is falling apart.

## 2013-02-07 NOTE — ED Notes (Signed)
Spoke with Waynetta Sandy, intake at [facility], awaiting physician approval to send pt. Pt is IVC per [facility] request to EDP.

## 2013-02-07 NOTE — ED Provider Notes (Addendum)
History     CSN: [id number]  Arrival date & time 02/07/13  0141   First MD Initiated Contact with Patient 02/07/13 [phone]      Chief Complaint  Patient presents with  . V70.1    (Consider location/radiation/quality/duration/timing/severity/associated sxs/prior treatment) HPI Isaac Jensen is a 47 y.o. male who presents to the Emergency Department complaining of suicidal ideation by hanging himself. He has tried hanging  Himself in the past. He missed an appointment with his psychiatrist and has not rescheduled. He will not contract for safety. He continues to take him medicines.  Past Medical History  Diagnosis Date  . Seizures   . Chronic pain   . Pacemaker   . Syncope   . Syncope   . Depression     Past Surgical History  Procedure Laterality Date  . Back surgery    . Neck surgery    . Knee arthroscopy    . Insert / replace / remove pacemaker    . Shoulder surgery      Family History  Problem Relation Age of Onset  . Hypertension Brother   . Heart attack Brother     History  Substance Use Topics  . Smoking status: Current Every Day Smoker -- 1.00 packs/day for 30 years    Types: Cigarettes  . Smokeless tobacco: Never Used  . Alcohol Use: No     Comment: pt reports a couple of sips of champagne on New Years      Review of Systems  Constitutional: Negative for fever.       10 Systems reviewed and are negative for acute change except as noted in the HPI.  HENT: Negative for congestion.   Eyes: Negative for discharge and redness.  Respiratory: Negative for cough and shortness of breath.   Cardiovascular: Negative for chest pain.  Gastrointestinal: Negative for vomiting and abdominal pain.  Musculoskeletal: Negative for back pain.  Skin: Negative for rash.  Neurological: Negative for syncope, numbness and headaches.    Allergies  Ketorolac tromethamine; Sulfa drugs cross reactors; Tramadol; Penicillins; and Trazodone and nefazodone  Home Medications    Current Outpatient Rx  Name  Route  Sig  Dispense  Refill  . doxycycline (VIBRAMYCIN) 100 MG capsule   Oral   Take 1 capsule (100 mg total) by mouth 2 (two) times daily.   14 capsule   0   . DULoxetine (CYMBALTA) 60 MG capsule   Oral   Take 60 mg by mouth daily.         Marland Kitchen oxymorphone (OPANA ER) 30 MG 12 hr tablet   Oral   Take 30 mg by mouth every 12 (twelve) hours.         . pregabalin (LYRICA) 300 MG capsule   Oral   Take 300 mg by mouth 2 (two) times daily.         Marland Kitchen tiZANidine (ZANAFLEX) 4 MG tablet   Oral   Take 4-12 mg by mouth 4 (four) times daily. 4mg  three times day and 12 mg at bedtime         . albuterol (PROVENTIL HFA;VENTOLIN HFA) 108 (90 BASE) MCG/ACT inhaler   Inhalation   Inhale 2 puffs into the lungs every 4 (four) hours as needed for wheezing.   1 Inhaler   0   . HYDROcodone-acetaminophen (LORTAB 7.5) 7.5-500 MG per tablet   Oral   Take 1 tablet by mouth every 4 (four) hours as needed for pain.   20 tablet  0   . Menthol-Methyl Salicylate (MUSCLE RUB) 10-15 % CREA   Topical   Apply 1 application topically as needed. FOR PAIN (back pain)         . oxyCODONE-acetaminophen (PERCOCET) 10-325 MG per tablet   Oral   Take 1 tablet by mouth 2 (two) times daily.           BP 131/85  Pulse 82  Temp(Src) 98.4 F (36.9 C) (Oral)  Resp 18  Ht 5\' 10"  (1.778 m)  Wt 170 lb (77.111 kg)  BMI 24.39 kg/m2  SpO2 98%  Physical Exam  Nursing note and vitals reviewed. Constitutional: He appears well-developed and well-nourished.  Awake, alert, nontoxic appearance.  HENT:  Head: Normocephalic and atraumatic.  Eyes: EOM are normal. Pupils are equal, round, and reactive to light. Right eye exhibits no discharge. Left eye exhibits no discharge.  Neck: Neck supple.  Cardiovascular: Normal rate and intact distal pulses.   Pulmonary/Chest: Effort normal and breath sounds normal. He exhibits no tenderness.  Abdominal: Soft. Bowel sounds are normal.  There is no tenderness. There is no rebound.  Musculoskeletal: He exhibits no tenderness.  Baseline ROM, no obvious new focal weakness.  Neurological:  Mental status and motor strength appears baseline for patient and situation.  Skin: No rash noted.  Psychiatric:  Patient will not contract for safety. He continues to endorse suicidal thoughts.    ED Course  Procedures (including critical care time) Results for orders placed during the hospital encounter of 02/07/13  CBC WITH DIFFERENTIAL      Result Value Range   WBC 20.0 (*) 4.0 - 10.5 K/uL   RBC 4.06 (*) 4.22 - 5.81 MIL/uL   Hemoglobin 12.1 (*) 13.0 - 17.0 g/dL   HCT 16.1 (*) 09.6 - 04.5 %   MCV 88.9  78.0 - 100.0 fL   MCH 29.8  26.0 - 34.0 pg   MCHC 33.5  30.0 - 36.0 g/dL   RDW 40.9  81.1 - 91.4 %   Platelets 356  150 - 400 K/uL   Neutrophils Relative 85 (*) 43 - 77 %   Neutro Abs 17.0 (*) 1.7 - 7.7 K/uL   Lymphocytes Relative 9 (*) 12 - 46 %   Lymphs Abs 1.8  0.7 - 4.0 K/uL   Monocytes Relative 6  3 - 12 %   Monocytes Absolute 1.2 (*) 0.1 - 1.0 K/uL   Eosinophils Relative 0  0 - 5 %   Eosinophils Absolute 0.0  0.0 - 0.7 K/uL   Basophils Relative 0  0 - 1 %   Basophils Absolute 0.0  0.0 - 0.1 K/uL  BASIC METABOLIC PANEL      Result Value Range   Sodium 138  135 - 145 mEq/L   Potassium 2.9 (*) 3.5 - 5.1 mEq/L   Chloride 99  96 - 112 mEq/L   CO2 26  19 - 32 mEq/L   Glucose, Bld 134 (*) 70 - 99 mg/dL   BUN 12  6 - 23 mg/dL   Creatinine, Ser 7.82  0.50 - 1.35 mg/dL   Calcium 9.4  8.4 - 95.6 mg/dL   GFR calc non Af Amer >90  >90 mL/min   GFR calc Af Amer >90  >90 mL/min  ETHANOL      Result Value Range   Alcohol, Ethyl (B) <11  0 - 11 mg/dL  URINE RAPID DRUG SCREEN (HOSP PERFORMED)      Result Value Range   Opiates NONE DETECTED  NONE DETECTED   Cocaine NONE DETECTED  NONE DETECTED   Benzodiazepines NONE DETECTED  NONE DETECTED   Amphetamines NONE DETECTED  NONE DETECTED   Tetrahydrocannabinol POSITIVE (*) NONE  DETECTED   Barbiturates NONE DETECTED  NONE DETECTED    0419 Spoke with Dr. Rob Bunting MD, tele-psych. He will evaluate patient. 4782 Dr. Rob Bunting called me with recommendations for admission. He advised IVC paperwork would be a good idea. Made medication recommendations, Cymbalta 60 mg QD, Risperdal 3 mg QHS. Written report is in the department. IVC paperwork has been filled out and psych hold orders are in place.   MDM  Patient with suicidal ideation. Labs with low potassium which was replaced here. Elevated wbc without infection. UDS positive for THC. Tele-psych completed with recommendation for admission. Felicia wit ACT has seen patient and working on placement.  [id number] has accepted the patient pending a repeat potassium higher than 3.2. MDM Reviewed: nursing note and vitals Interpretation: labs           Nicoletta Dress. Colon Branch, MD 02/07/13 1308  Nicoletta Dress. Colon Branch, MD 02/07/13 [phone]

## 2013-02-19 ENCOUNTER — Emergency Department (HOSPITAL_COMMUNITY)
Admission: EM | Admit: 2013-02-19 | Discharge: 2013-02-19 | Disposition: A | Discharge status: Home / Self Care | Payer: Medicare Other | Attending: Emergency Medicine | Admitting: Emergency Medicine

## 2013-02-19 ENCOUNTER — Encounter (HOSPITAL_COMMUNITY): Payer: Self-pay | Admitting: Emergency Medicine

## 2013-02-19 DIAGNOSIS — M542 Cervicalgia: Secondary | ICD-10-CM | POA: Insufficient documentation

## 2013-02-19 DIAGNOSIS — M545 Low back pain, unspecified: Secondary | ICD-10-CM | POA: Insufficient documentation

## 2013-02-19 DIAGNOSIS — M549 Dorsalgia, unspecified: Secondary | ICD-10-CM

## 2013-02-19 DIAGNOSIS — F3289 Other specified depressive episodes: Secondary | ICD-10-CM | POA: Insufficient documentation

## 2013-02-19 DIAGNOSIS — Z950 Presence of cardiac pacemaker: Secondary | ICD-10-CM | POA: Insufficient documentation

## 2013-02-19 DIAGNOSIS — Z79899 Other long term (current) drug therapy: Secondary | ICD-10-CM | POA: Insufficient documentation

## 2013-02-19 DIAGNOSIS — F329 Major depressive disorder, single episode, unspecified: Secondary | ICD-10-CM | POA: Insufficient documentation

## 2013-02-19 DIAGNOSIS — F17200 Nicotine dependence, unspecified, uncomplicated: Secondary | ICD-10-CM | POA: Insufficient documentation

## 2013-02-19 DIAGNOSIS — G40909 Epilepsy, unspecified, not intractable, without status epilepticus: Secondary | ICD-10-CM | POA: Insufficient documentation

## 2013-02-19 DIAGNOSIS — G8929 Other chronic pain: Secondary | ICD-10-CM | POA: Insufficient documentation

## 2013-02-19 DIAGNOSIS — Z8669 Personal history of other diseases of the nervous system and sense organs: Secondary | ICD-10-CM | POA: Insufficient documentation

## 2013-02-19 MED ORDER — OXYCODONE-ACETAMINOPHEN 5-325 MG PO TABS
1.0000 | ORAL_TABLET | Freq: Once | ORAL | Status: AC
  Administered 2013-02-19: 1 via ORAL
  Filled 2013-02-19: qty 1

## 2013-02-19 MED ORDER — OXYCODONE-ACETAMINOPHEN 5-325 MG PO TABS: 1.0000 | ORAL_TABLET | ORAL | Status: DC | PRN

## 2013-02-19 NOTE — ED Provider Notes (Signed)
History     CSN: [id number]  Arrival date & time 02/19/13  1113   First MD Initiated Contact with Patient 02/19/13 1125      Chief Complaint  Patient presents with  . Back Pain    (Consider location/radiation/quality/duration/timing/severity/associated sxs/prior treatment) Patient is a 47 y.o. male presenting with back pain. The history is provided by the patient.  Back Pain Location:  Lumbar spine (cervical spine) Quality:  Aching Radiates to:  Does not radiate Pain severity:  Moderate Pain is:  Same all the time Onset quality:  Unable to specify Timing:  Constant Progression:  Unchanged Chronicity:  Chronic Context: not falling, not lifting heavy objects, not recent illness, not recent injury and not twisting   Relieved by:  Nothing Worsened by:  Movement, twisting, sitting, bending and ambulation Ineffective treatments:  Ibuprofen Associated symptoms: no abdominal pain, no abdominal swelling, no bladder incontinence, no bowel incontinence, no chest pain, no dysuria, no fever, no headaches, no leg pain, no numbness, no paresthesias, no pelvic pain, no perianal numbness, no tingling and no weakness     Past Medical History  Diagnosis Date  . Seizures   . Chronic pain   . Pacemaker   . Syncope   . Syncope   . Depression     Past Surgical History  Procedure Laterality Date  . Back surgery    . Neck surgery    . Knee arthroscopy    . Insert / replace / remove pacemaker    . Shoulder surgery      Family History  Problem Relation Age of Onset  . Hypertension Brother   . Heart attack Brother     History  Substance Use Topics  . Smoking status: Current Every Day Smoker -- 1.00 packs/day for 30 years    Types: Cigarettes  . Smokeless tobacco: Never Used  . Alcohol Use: No     Comment: pt reports a couple of sips of champagne on New Years      Review of Systems  Constitutional: Negative for fever.  HENT: Positive for neck pain. Negative for facial  swelling and neck stiffness.   Respiratory: Negative for shortness of breath.   Cardiovascular: Negative for chest pain.  Gastrointestinal: Negative for vomiting, abdominal pain, constipation and bowel incontinence.  Genitourinary: Negative for bladder incontinence, dysuria, hematuria, flank pain, decreased urine volume, difficulty urinating and pelvic pain.       No perineal numbness or incontinence of urine or feces  Musculoskeletal: Positive for back pain. Negative for joint swelling.  Skin: Negative for rash.  Neurological: Negative for dizziness, tingling, syncope, weakness, numbness, headaches and paresthesias.  All other systems reviewed and are negative.    Allergies  Ketorolac tromethamine; Sulfa drugs cross reactors; Tramadol; Penicillins; and Trazodone and nefazodone  Home Medications   Current Outpatient Rx  Name  Route  Sig  Dispense  Refill  . albuterol (PROVENTIL HFA;VENTOLIN HFA) 108 (90 BASE) MCG/ACT inhaler   Inhalation   Inhale 2 puffs into the lungs every 4 (four) hours as needed for wheezing.   1 Inhaler   0   . DULoxetine (CYMBALTA) 60 MG capsule   Oral   Take 60 mg by mouth daily.         Marland Kitchen oxyCODONE-acetaminophen (PERCOCET) 10-325 MG per tablet   Oral   Take 1 tablet by mouth 2 (two) times daily.         Marland Kitchen oxymorphone (OPANA ER) 40 MG 12 hr tablet   Oral  Take 40 mg by mouth every 12 (twelve) hours.         . pregabalin (LYRICA) 300 MG capsule   Oral   Take 300 mg by mouth 2 (two) times daily.         Marland Kitchen tiZANidine (ZANAFLEX) 4 MG tablet   Oral   Take 4-16 mg by mouth 4 (four) times daily. 4mg  three times day and 16 mg at bedtime           BP 90/57  Temp(Src) 98.7 F (37.1 C) (Oral)  Resp 18  SpO2 100%  Physical Exam  Nursing note and vitals reviewed. Constitutional: He is oriented to person, place, and time. He appears well-developed and well-nourished. No distress.  HENT:  Head: Normocephalic and atraumatic.  Neck: Normal  range of motion. Neck supple.  Cardiovascular: Normal rate, regular rhythm and intact distal pulses.   No murmur heard. Pulmonary/Chest: Effort normal and breath sounds normal.  Musculoskeletal: He exhibits tenderness. He exhibits no edema.       Lumbar back: He exhibits tenderness and pain. He exhibits normal range of motion, no swelling, no deformity, no laceration and normal pulse.       Back:  ttp of the cervical and lumbar spine and paraspinal muscles.  Distal sensation intact.  Radial and DP pulses are brisk and symmetrical.  No peripheral edema.  Pt has full ROM of the bilateral upper and lower extremities.   Neurological: He is alert and oriented to person, place, and time. No cranial nerve deficit or sensory deficit. He exhibits normal muscle tone. Coordination and gait normal.  Reflex Scores:      Tricep reflexes are 2+ on the right side and 2+ on the left side.      Bicep reflexes are 2+ on the right side and 2+ on the left side.      Patellar reflexes are 2+ on the right side and 2+ on the left side.      Achilles reflexes are 2+ on the right side and 2+ on the left side. Skin: Skin is warm and dry.    ED Course  Procedures (including critical care time)  Labs Reviewed - No data to display No results found.      MDM   Patient has ttp of the lumbar and cervical spine and paraspinal muscles.  No focal neuro deficits on exam.  Ambulates with a steady gait.   Patient recently discharged from his pain clinic in [facility]  He is requesting referral for PMD.  Doubt emergent neurological or infectious process  The patient appears reasonably screened and/or stabilized for discharge and I doubt any other medical condition or other [facility] requiring further screening, evaluation, or treatment in the ED at this time prior to discharge.         Samanthan Dugo L. Trisha Mangle, PA-C 02/22/13 1317

## 2013-02-19 NOTE — ED Notes (Signed)
Pt states he has chronic neck and lower back pain and has recently ran out of pain meds. Pt no longer has a pain management doctor or a primary doctor.

## 2013-02-20 ENCOUNTER — Emergency Department (HOSPITAL_COMMUNITY)
Admission: EM | Admit: 2013-02-20 | Discharge: 2013-02-20 | Discharge status: Against Medical Advice | Payer: Medicare Other | Attending: Emergency Medicine | Admitting: Emergency Medicine

## 2013-02-20 ENCOUNTER — Encounter (HOSPITAL_COMMUNITY): Payer: Self-pay | Admitting: *Deleted

## 2013-02-20 DIAGNOSIS — F17200 Nicotine dependence, unspecified, uncomplicated: Secondary | ICD-10-CM | POA: Insufficient documentation

## 2013-02-20 DIAGNOSIS — F329 Major depressive disorder, single episode, unspecified: Secondary | ICD-10-CM | POA: Insufficient documentation

## 2013-02-20 DIAGNOSIS — F3289 Other specified depressive episodes: Secondary | ICD-10-CM | POA: Insufficient documentation

## 2013-02-20 DIAGNOSIS — R45851 Suicidal ideations: Secondary | ICD-10-CM | POA: Insufficient documentation

## 2013-02-20 DIAGNOSIS — G8929 Other chronic pain: Secondary | ICD-10-CM | POA: Insufficient documentation

## 2013-02-20 NOTE — ED Notes (Signed)
Patient cannot be found

## 2013-02-20 NOTE — ED Notes (Signed)
Unable to locate pt in all waiting areas x 1

## 2013-02-20 NOTE — ED Notes (Signed)
Called fro room placement

## 2013-02-20 NOTE — ED Notes (Signed)
States he is suicidal, states he needs some help for the past 2-3 weeks, states he has been seen for same in the past and has been sent home

## 2013-02-22 NOTE — ED Provider Notes (Signed)
Medical screening examination/treatment/procedure(s) were performed by non-physician practitioner and as supervising physician I was immediately available for consultation/collaboration.  Donnetta Hutching, MD 02/22/13 [phone]

## 2013-03-19 ENCOUNTER — Other Ambulatory Visit (HOSPITAL_COMMUNITY): Payer: Self-pay | Admitting: Family Medicine

## 2013-03-19 ENCOUNTER — Ambulatory Visit (HOSPITAL_COMMUNITY)
Admission: RE | Admit: 2013-03-19 | Discharge: 2013-03-19 | Disposition: A | Discharge status: Home / Self Care | Payer: Medicare Other | Source: Ambulatory Visit | Attending: Family Medicine | Admitting: Family Medicine

## 2013-03-19 DIAGNOSIS — F17200 Nicotine dependence, unspecified, uncomplicated: Secondary | ICD-10-CM | POA: Insufficient documentation

## 2013-03-19 DIAGNOSIS — Z950 Presence of cardiac pacemaker: Secondary | ICD-10-CM | POA: Insufficient documentation

## 2013-04-08 IMAGING — CR DG FOREARM 2V*R*
2 series · 2 of 2 positions shown · non-contrast
Comparison: None.

CLINICAL DATA: Forearm mass.  Rule out foreign body

RIGHT FOREARM - 2 VIEW

[view not recorded (1 of 2)]
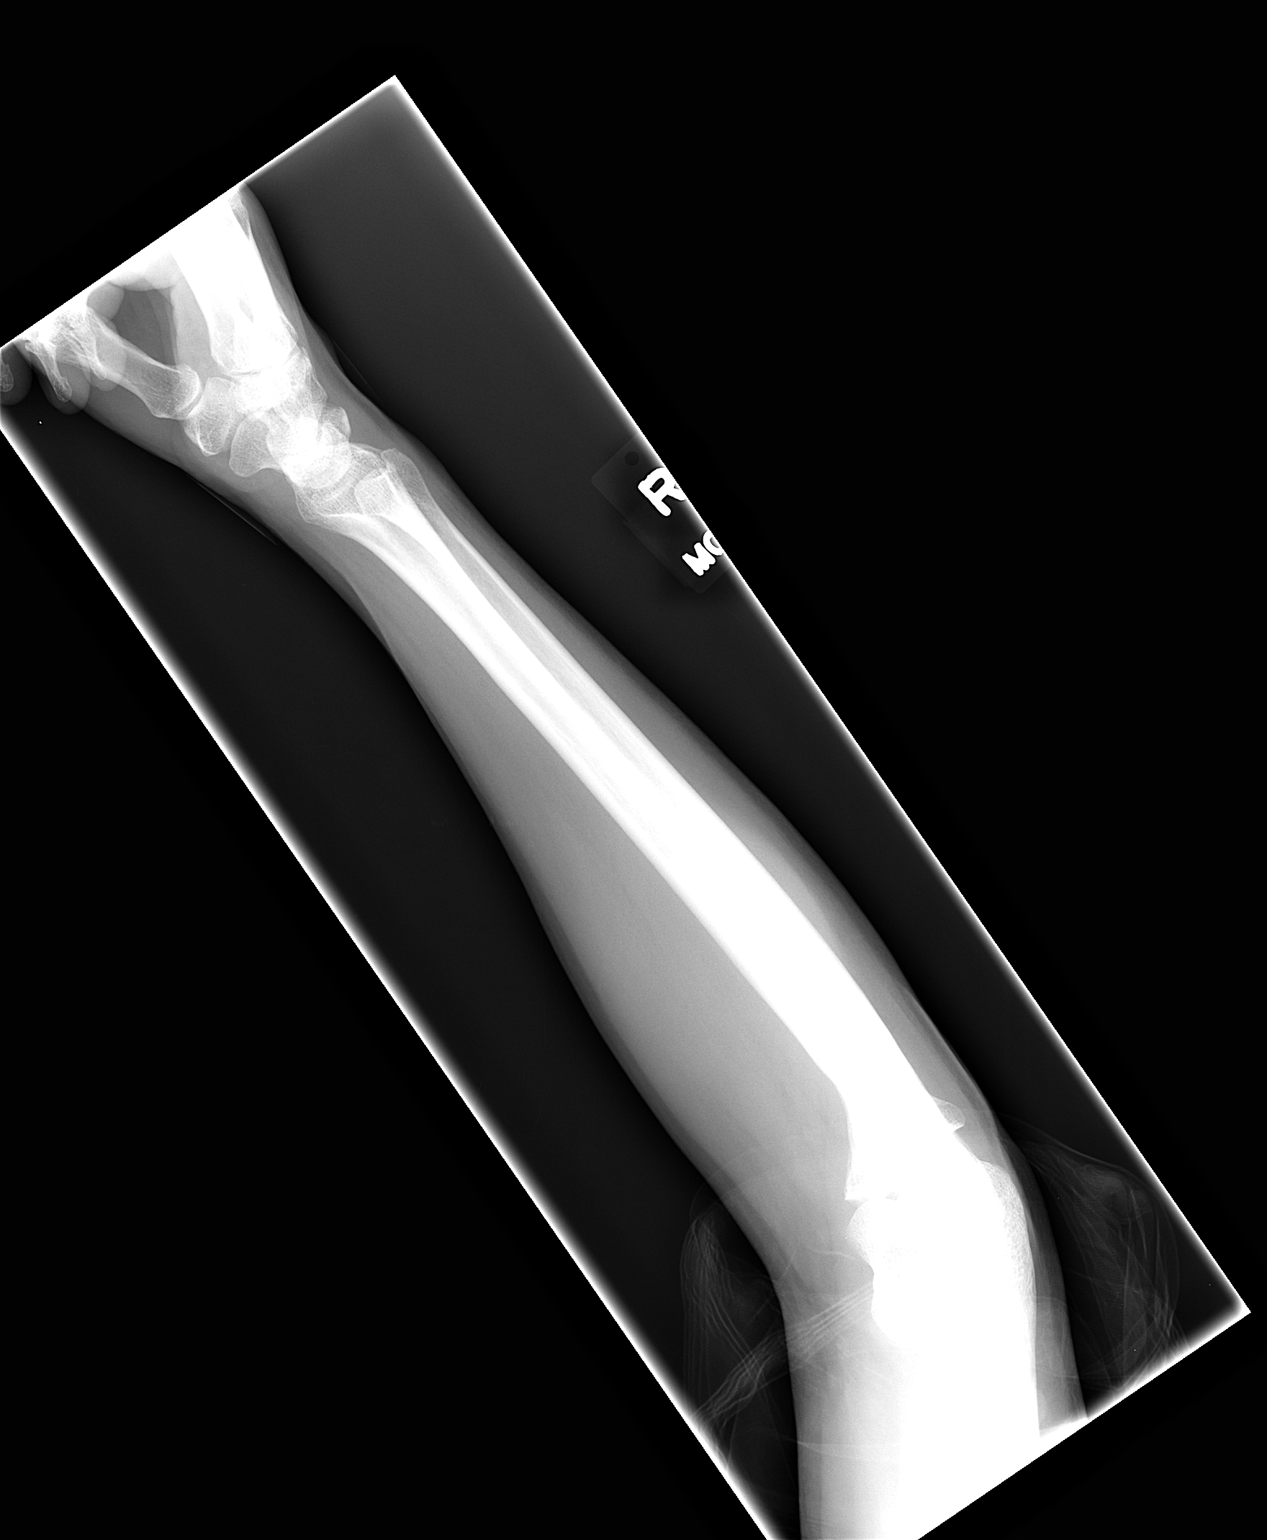

[view not recorded (2 of 2)]
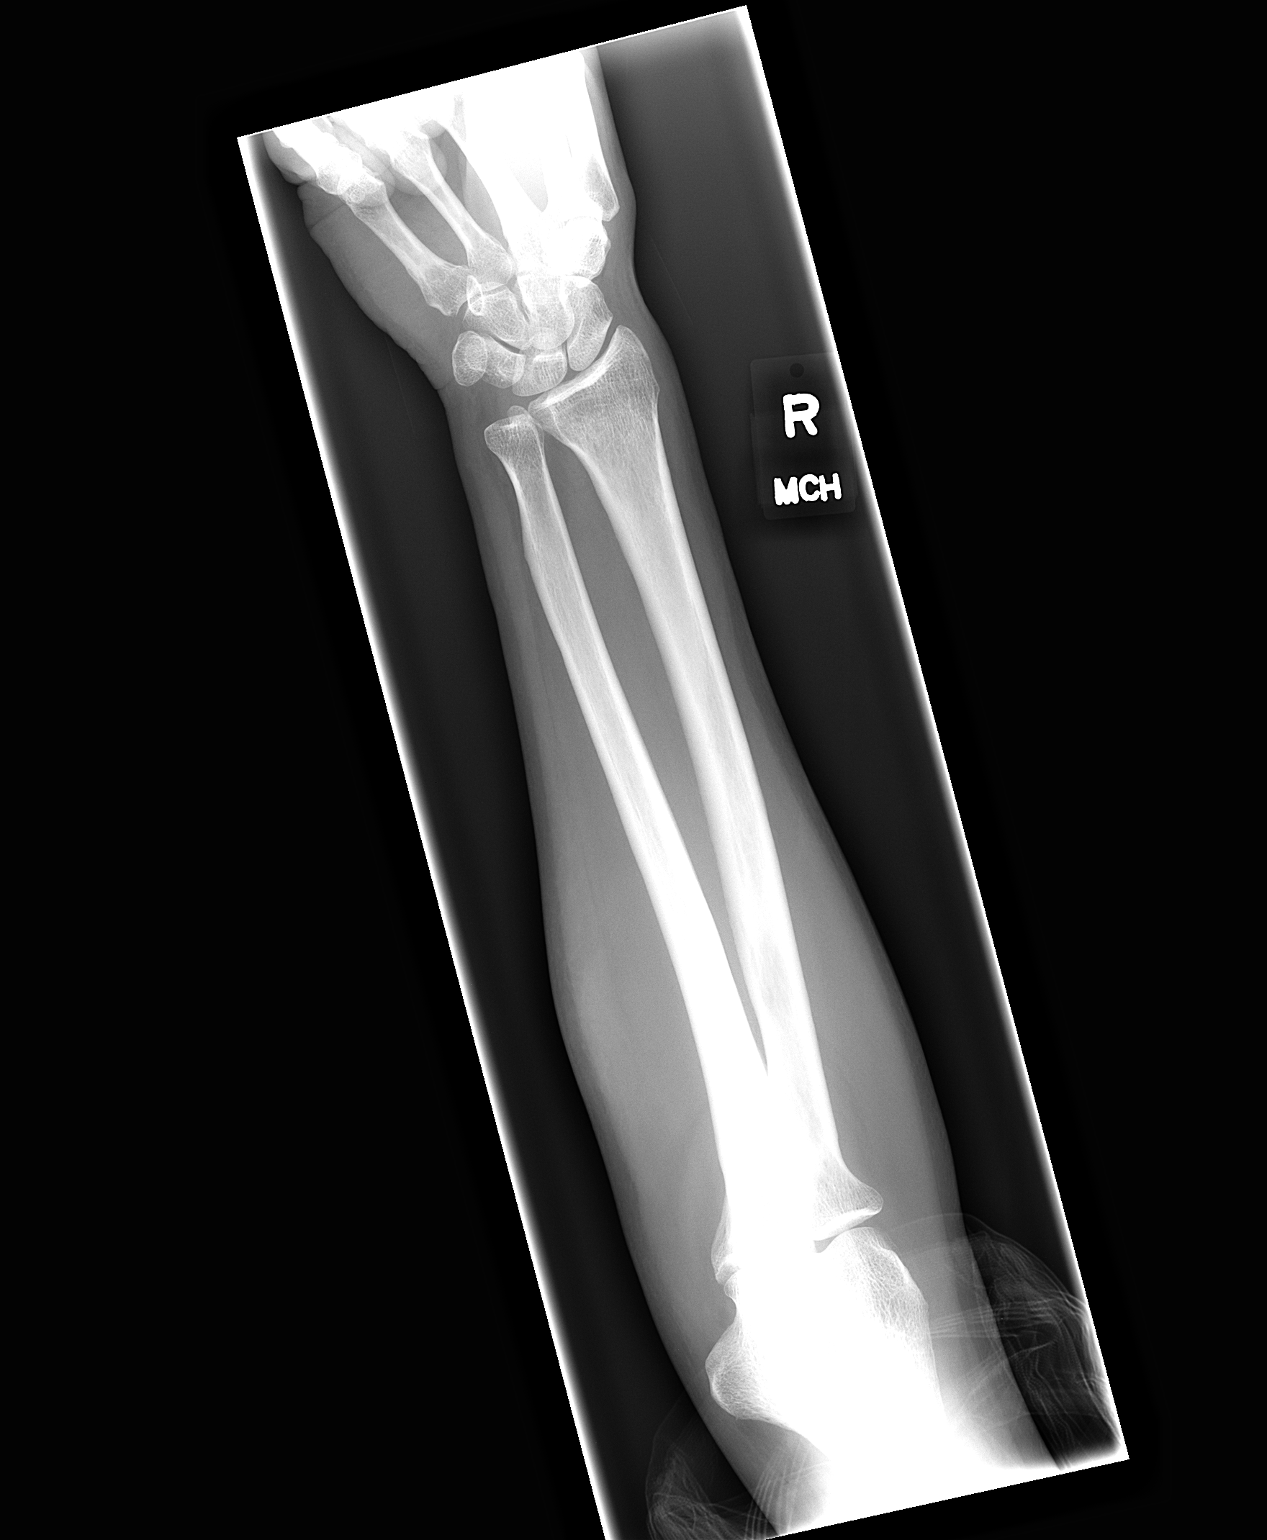

[2 of 2 positions shown; findings below may reference images not displayed]

FINDINGS: There is no evidence of fracture or other focal bone
lesions.  Soft tissues are unremarkable. Negative for foreign body.
IMPRESSION: Negative.

## 2013-04-08 IMAGING — CR DG CHEST 1V PORT
1 series · 1 of 1 positions shown · non-contrast
Comparison: 06/20/2011

CLINICAL DATA: Syncope

PORTABLE CHEST - 1 VIEW

[view not recorded]
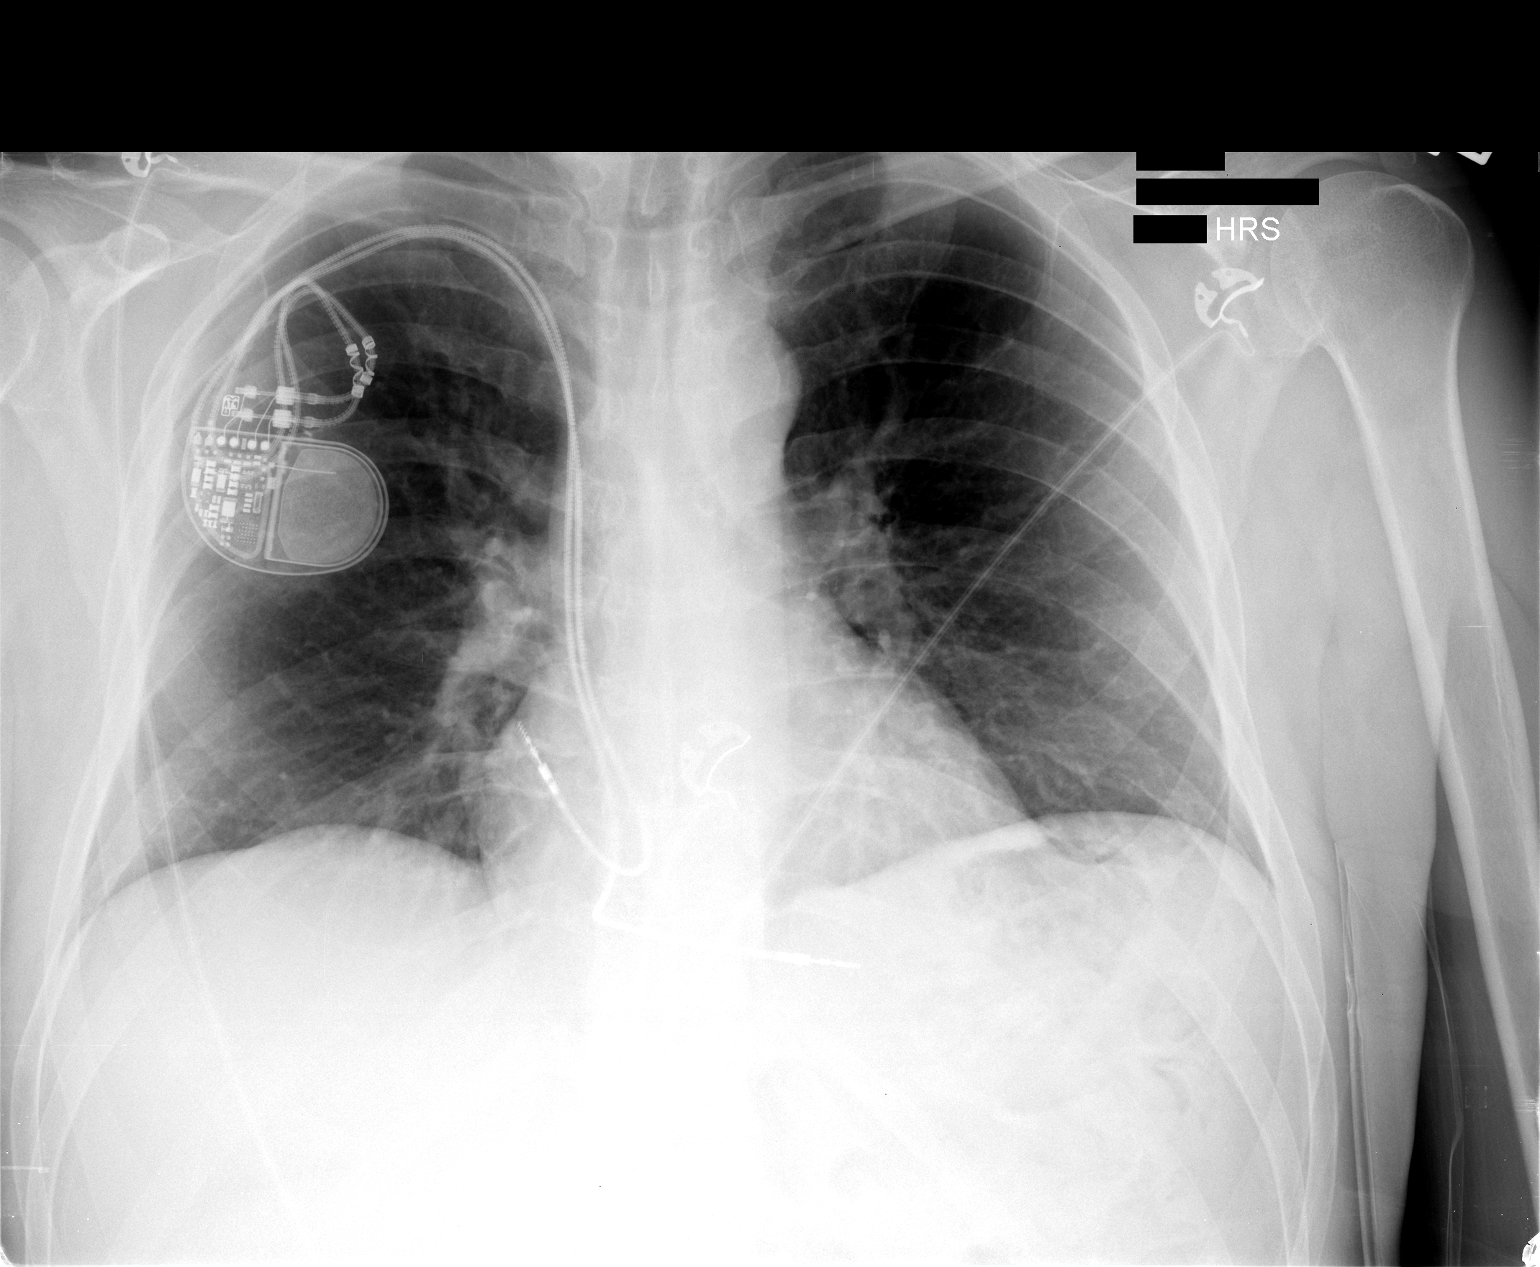

[1 of 1 positions shown; findings below may reference images not displayed]

FINDINGS: Right subclavian dual lead pacemaker stable in position.
Relatively low lung volumes.  Lungs clear.  Heart size normal.  No
effusion.  Regional bones unremarkable.
IMPRESSION: No acute disease

## 2013-06-24 ENCOUNTER — Encounter (HOSPITAL_COMMUNITY): Payer: Self-pay | Admitting: *Deleted

## 2013-06-24 ENCOUNTER — Emergency Department (HOSPITAL_COMMUNITY)
Admission: EM | Admit: 2013-06-24 | Discharge: 2013-06-24 | Disposition: A | Discharge status: Home / Self Care | Payer: Medicare Other | Attending: Emergency Medicine | Admitting: Emergency Medicine

## 2013-06-24 DIAGNOSIS — Z79899 Other long term (current) drug therapy: Secondary | ICD-10-CM | POA: Insufficient documentation

## 2013-06-24 DIAGNOSIS — M545 Low back pain, unspecified: Secondary | ICD-10-CM | POA: Insufficient documentation

## 2013-06-24 DIAGNOSIS — Z950 Presence of cardiac pacemaker: Secondary | ICD-10-CM | POA: Insufficient documentation

## 2013-06-24 DIAGNOSIS — Z8669 Personal history of other diseases of the nervous system and sense organs: Secondary | ICD-10-CM | POA: Insufficient documentation

## 2013-06-24 DIAGNOSIS — F329 Major depressive disorder, single episode, unspecified: Secondary | ICD-10-CM | POA: Insufficient documentation

## 2013-06-24 DIAGNOSIS — F17200 Nicotine dependence, unspecified, uncomplicated: Secondary | ICD-10-CM | POA: Insufficient documentation

## 2013-06-24 DIAGNOSIS — F3289 Other specified depressive episodes: Secondary | ICD-10-CM | POA: Insufficient documentation

## 2013-06-24 DIAGNOSIS — G8929 Other chronic pain: Secondary | ICD-10-CM | POA: Insufficient documentation

## 2013-06-24 DIAGNOSIS — Z880 Allergy status to penicillin: Secondary | ICD-10-CM | POA: Insufficient documentation

## 2013-06-24 DIAGNOSIS — Z9889 Other specified postprocedural states: Secondary | ICD-10-CM | POA: Insufficient documentation

## 2013-06-24 MED ORDER — PREDNISONE 20 MG PO TABS: 60.0000 mg | ORAL_TABLET | Freq: Every day | ORAL | Status: DC

## 2013-06-24 MED ORDER — CYCLOBENZAPRINE HCL 10 MG PO TABS: 10.0000 mg | ORAL_TABLET | Freq: Two times a day (BID) | ORAL | Status: DC | PRN

## 2013-06-24 MED ORDER — CYCLOBENZAPRINE HCL 10 MG PO TABS
10.0000 mg | ORAL_TABLET | Freq: Three times a day (TID) | ORAL | Status: DC | PRN
  Administered 2013-06-24: 10 mg via ORAL
  Filled 2013-06-24: qty 1

## 2013-06-24 MED ORDER — HYDROMORPHONE HCL PF 1 MG/ML IJ SOLN
1.0000 mg | Freq: Once | INTRAMUSCULAR | Status: AC
  Administered 2013-06-24: 1 mg via INTRAMUSCULAR
  Filled 2013-06-24: qty 1

## 2013-06-24 NOTE — ED Provider Notes (Signed)
CSN: [id number]     Arrival date & time 06/24/13  0455 History     First MD Initiated Contact with Patient 06/24/13 [phone]     Chief Complaint  Patient presents with  . Back Pain   (Consider location/radiation/quality/duration/timing/severity/associated sxs/prior Treatment) HPI History provided by patient. Has chronic low back pain status post surgery by Dr. Manson Passey at [facility]. The last 3 days has had more severe pain radiates to his left leg. No associated weakness or numbness. No fevers or chills. No incontinence of bowel or bladder. Feels like his typical chronic pain just more severe. Patient has a pacemaker and is unable to get MRIs. He last had a myelogram at [facility] about a year ago.  Pain is worse with walking and movement. No known alleviating factors. Past Medical History  Diagnosis Date  . Seizures   . Chronic pain   . Pacemaker   . Syncope   . Syncope   . Depression    Past Surgical History  Procedure Laterality Date  . Back surgery    . Neck surgery    . Knee arthroscopy    . Insert / replace / remove pacemaker    . Shoulder surgery     Family History  Problem Relation Age of Onset  . Hypertension Brother   . Heart attack Brother    History  Substance Use Topics  . Smoking status: Current Every Day Smoker -- 1.00 packs/day for 30 years    Types: Cigarettes  . Smokeless tobacco: Never Used  . Alcohol Use: No    Review of Systems  Constitutional: Negative for fever and chills.  HENT: Negative for neck pain and neck stiffness.   Eyes: Negative for pain.  Respiratory: Negative for shortness of breath.   Cardiovascular: Negative for chest pain.  Gastrointestinal: Negative for abdominal pain.  Genitourinary: Negative for dysuria.  Musculoskeletal: Positive for back pain.  Skin: Negative for rash.  Neurological: Negative for headaches.  All other systems reviewed and are negative.    Allergies  Ketorolac tromethamine; Sulfa drugs cross  reactors; Tramadol; Penicillins; and Trazodone and nefazodone  Home Medications   Current Outpatient Rx  Name  Route  Sig  Dispense  Refill  . albuterol (PROVENTIL HFA;VENTOLIN HFA) 108 (90 BASE) MCG/ACT inhaler   Inhalation   Inhale 2 puffs into the lungs every 4 (four) hours as needed for wheezing.   1 Inhaler   0   . DULoxetine (CYMBALTA) 60 MG capsule   Oral   Take 60 mg by mouth daily.         Marland Kitchen oxyCODONE-acetaminophen (PERCOCET) 10-325 MG per tablet   Oral   Take 1 tablet by mouth 2 (two) times daily.         Marland Kitchen oxyCODONE-acetaminophen (PERCOCET/ROXICET) 5-325 MG per tablet   Oral   Take 1 tablet by mouth every 4 (four) hours as needed for pain.   8 tablet   0   . oxymorphone (OPANA ER) 40 MG 12 hr tablet   Oral   Take 40 mg by mouth every 12 (twelve) hours.         . pregabalin (LYRICA) 300 MG capsule   Oral   Take 300 mg by mouth 2 (two) times daily.         Marland Kitchen tiZANidine (ZANAFLEX) 4 MG tablet   Oral   Take 4-16 mg by mouth 4 (four) times daily. 4mg  three times day and 16 mg at bedtime  BP 127/89  Pulse 74  Temp(Src) 97.7 F (36.5 C) (Oral)  Resp 18  SpO2 98% Physical Exam  Constitutional: He is oriented to person, place, and time. He appears well-developed and well-nourished.  HENT:  Head: Normocephalic and atraumatic.  Eyes: EOM are normal. Pupils are equal, round, and reactive to light.  Neck: Neck supple.  Cardiovascular: Normal rate, regular rhythm and intact distal pulses.   Pulmonary/Chest: Effort normal and breath sounds normal. No respiratory distress.  Abdominal: Soft. Bowel sounds are normal. He exhibits no distension. There is no tenderness.  Musculoskeletal: Normal range of motion. He exhibits no edema.  Tender left para lumbar and over left iliac crest. No midline deformity or tenderness. Gait intact. No lower extremity deficits with equal motor and sensorium to light touch.   Neurological: He is alert and oriented to  person, place, and time.  Skin: Skin is warm and dry.    ED Course   Procedures (including critical care time)  IM Dilaudid.PO Flexeril. Ice  1. acute exacerbation of chronic low back pain  Plan discharge home with followup with his physician at [facility] Prescription for prednisone and Flexeril provided. Strict return precautions provided and verbalized is understood. No indication for admit or plain films at this time.   MDM  Low back pain no trauma. No deficits. Medications provided Vital signs nurse's notes reviewed and considered  Sunnie Nielsen, MD 06/24/13 [phone]

## 2013-06-24 NOTE — ED Notes (Addendum)
Pt reporting lower back pain for several days, worse in past 2 days.  No relief from heating pad or ibuprofen.  Reports pain in lower back,  left hip, radiating down leg.  Pt reporting history of chronic pain. Reporting that he is no longer in pain management.  States that the physician discharged him from the clinic stating "there is nothing more that can be done."

## 2013-07-06 ENCOUNTER — Emergency Department (HOSPITAL_COMMUNITY)
Admission: EM | Admit: 2013-07-06 | Discharge: 2013-07-06 | Disposition: A | Discharge status: Home / Self Care | Payer: Medicare Other | Attending: Emergency Medicine | Admitting: Emergency Medicine

## 2013-07-06 ENCOUNTER — Encounter (HOSPITAL_COMMUNITY): Payer: Self-pay | Admitting: Emergency Medicine

## 2013-07-06 DIAGNOSIS — IMO0002 Reserved for concepts with insufficient information to code with codable children: Secondary | ICD-10-CM | POA: Insufficient documentation

## 2013-07-06 DIAGNOSIS — M545 Low back pain, unspecified: Secondary | ICD-10-CM | POA: Insufficient documentation

## 2013-07-06 DIAGNOSIS — F3289 Other specified depressive episodes: Secondary | ICD-10-CM | POA: Insufficient documentation

## 2013-07-06 DIAGNOSIS — G8929 Other chronic pain: Secondary | ICD-10-CM | POA: Insufficient documentation

## 2013-07-06 DIAGNOSIS — Z950 Presence of cardiac pacemaker: Secondary | ICD-10-CM | POA: Insufficient documentation

## 2013-07-06 DIAGNOSIS — G40909 Epilepsy, unspecified, not intractable, without status epilepticus: Secondary | ICD-10-CM | POA: Insufficient documentation

## 2013-07-06 DIAGNOSIS — Z79899 Other long term (current) drug therapy: Secondary | ICD-10-CM | POA: Insufficient documentation

## 2013-07-06 DIAGNOSIS — F17200 Nicotine dependence, unspecified, uncomplicated: Secondary | ICD-10-CM | POA: Insufficient documentation

## 2013-07-06 DIAGNOSIS — M549 Dorsalgia, unspecified: Secondary | ICD-10-CM

## 2013-07-06 DIAGNOSIS — Z880 Allergy status to penicillin: Secondary | ICD-10-CM | POA: Insufficient documentation

## 2013-07-06 DIAGNOSIS — F329 Major depressive disorder, single episode, unspecified: Secondary | ICD-10-CM | POA: Insufficient documentation

## 2013-07-06 MED ORDER — CYCLOBENZAPRINE HCL 10 MG PO TABS: 10.0000 mg | ORAL_TABLET | Freq: Two times a day (BID) | ORAL | Status: DC | PRN

## 2013-07-06 MED ORDER — PREDNISONE 50 MG PO TABS
60.0000 mg | ORAL_TABLET | Freq: Once | ORAL | Status: AC
  Administered 2013-07-06: 60 mg via ORAL
  Filled 2013-07-06: qty 1

## 2013-07-06 MED ORDER — PREDNISONE 20 MG PO TABS: 60.0000 mg | ORAL_TABLET | Freq: Every day | ORAL | Status: DC

## 2013-07-06 MED ORDER — CYCLOBENZAPRINE HCL 10 MG PO TABS
10.0000 mg | ORAL_TABLET | Freq: Once | ORAL | Status: AC
  Administered 2013-07-06: 10 mg via ORAL
  Filled 2013-07-06: qty 1

## 2013-07-06 NOTE — ED Notes (Signed)
Patient has history of lower back pain; states pain has increased in the past 3 days.

## 2013-07-06 NOTE — ED Notes (Signed)
Discharge instructions given and reviewed with patient.  Prescriptions given for Prednisone and Flexeril; effects and use explained.  Patient verbalized understanding to take medication as directed and to follow up as needed.  Patient ambulatory; discharged home in good condition.

## 2013-07-06 NOTE — ED Provider Notes (Signed)
CSN: [id number]     Arrival date & time 07/06/13  0330 History     First MD Initiated Contact with Patient 07/06/13 [phone]     Chief Complaint  Patient presents with  . Back Pain   (Consider location/radiation/quality/duration/timing/severity/associated sxs/prior Treatment) HPI History provided by patient. Has chronic low back pain status post surgery by Dr. Manson Passey at [facility]. The last 1-2 days has had more severe pain radiates to his left leg. No associated weakness or numbness. No fevers or chills. No incontinence of bowel or bladder. Feels like his typical chronic pain just more severe. Patient has a pacemaker and is unable to get MRIs. He last had a myelogram at [facility] about a year ago. Pain is worse with walking and movement. No known alleviating factors.  Patient was evaluated for the same pain recently and just finished a prescription of prednisone and Flexeril. He is requesting more medications. No new symptoms.  Past Medical History  Diagnosis Date  . Seizures   . Chronic pain   . Pacemaker   . Syncope   . Syncope   . Depression    Past Surgical History  Procedure Laterality Date  . Back surgery    . Neck surgery    . Knee arthroscopy    . Insert / replace / remove pacemaker    . Shoulder surgery     Family History  Problem Relation Age of Onset  . Hypertension Brother   . Heart attack Brother    History  Substance Use Topics  . Smoking status: Current Every Day Smoker -- 1.00 packs/day for 30 years    Types: Cigarettes  . Smokeless tobacco: Never Used  . Alcohol Use: No    Review of Systems  Constitutional: Negative for fever and chills.  HENT: Negative for neck pain and neck stiffness.   Eyes: Negative for pain.  Respiratory: Negative for shortness of breath.   Cardiovascular: Negative for chest pain.  Gastrointestinal: Negative for abdominal pain.  Genitourinary: Negative for dysuria.  Musculoskeletal: Positive for back pain.  Skin:  Negative for rash.  Neurological: Negative for weakness and numbness.  All other systems reviewed and are negative.    Allergies  Ketorolac tromethamine; Sulfa drugs cross reactors; Tramadol; Penicillins; and Trazodone and nefazodone  Home Medications   Current Outpatient Rx  Name  Route  Sig  Dispense  Refill  . pregabalin (LYRICA) 300 MG capsule   Oral   Take 300 mg by mouth 2 (two) times daily.         Marland Kitchen albuterol (PROVENTIL HFA;VENTOLIN HFA) 108 (90 BASE) MCG/ACT inhaler   Inhalation   Inhale 2 puffs into the lungs every 4 (four) hours as needed for wheezing.   1 Inhaler   0   . cyclobenzaprine (FLEXERIL) 10 MG tablet   Oral   Take 1 tablet (10 mg total) by mouth 2 (two) times daily as needed for muscle spasms.   20 tablet   0   . cyclobenzaprine (FLEXERIL) 10 MG tablet   Oral   Take 1 tablet (10 mg total) by mouth 2 (two) times daily as needed for muscle spasms.   20 tablet   0   . DULoxetine (CYMBALTA) 60 MG capsule   Oral   Take 60 mg by mouth daily.         Marland Kitchen oxyCODONE-acetaminophen (PERCOCET) 10-325 MG per tablet   Oral   Take 1 tablet by mouth 2 (two) times daily.         Marland Kitchen  oxyCODONE-acetaminophen (PERCOCET/ROXICET) 5-325 MG per tablet   Oral   Take 1 tablet by mouth every 4 (four) hours as needed for pain.   8 tablet   0   . oxymorphone (OPANA ER) 40 MG 12 hr tablet   Oral   Take 40 mg by mouth every 12 (twelve) hours.         . predniSONE (DELTASONE) 20 MG tablet   Oral   Take 3 tablets (60 mg total) by mouth daily.   15 tablet   0   . predniSONE (DELTASONE) 20 MG tablet   Oral   Take 3 tablets (60 mg total) by mouth daily.   15 tablet   0   . tiZANidine (ZANAFLEX) 4 MG tablet   Oral   Take 4-16 mg by mouth 4 (four) times daily. 4mg  three times day and 16 mg at bedtime          BP 121/78  Pulse 69  Temp(Src) 98 F (36.7 C) (Oral)  Resp 18  Ht 5\' 10"  (1.778 m)  Wt 175 lb (79.379 kg)  BMI 25.11 kg/m2  SpO2  100% Physical Exam  Constitutional: He is oriented to person, place, and time. He appears well-developed and well-nourished.  HENT:  Head: Normocephalic and atraumatic.  Eyes: EOM are normal. Pupils are equal, round, and reactive to light.  Neck: Neck supple.  Cardiovascular: Normal rate, regular rhythm and intact distal pulses.   Pulmonary/Chest: Effort normal and breath sounds normal. No respiratory distress.  Musculoskeletal: Normal range of motion. He exhibits no edema.  Tender left lower paralumbar region without midline tenderness or deformity. No lower extremity deficits with equal sensorium to light touch throughout and equal DTRs. Normal gait intact.   Neurological: He is alert and oriented to person, place, and time.  Skin: Skin is warm and dry.    ED Course   Procedures (including critical care time)   1. Back pain    Ice, Flexeril and prednisone provided.  Discharge home with outpatient followup. Back pain precautions provided and verbalized as understood.  MDM  Chronic low back pain with acute exacerbation of the same. No new deficits or indication for emergent imaging.  Previous records reviewed. Patient presented with the same symptoms last week. Holding any narcotics given chronic pain  Medications provided.  Vital signs the nurse's notes reviewed  Sunnie Nielsen, MD 07/06/13 [phone]

## 2013-07-30 ENCOUNTER — Emergency Department (HOSPITAL_COMMUNITY)
Admission: EM | Admit: 2013-07-30 | Discharge: 2013-07-30 | Disposition: A | Discharge status: Home / Self Care | Payer: Medicare Other | Attending: Emergency Medicine | Admitting: Emergency Medicine

## 2013-07-30 ENCOUNTER — Encounter (HOSPITAL_COMMUNITY): Payer: Self-pay | Admitting: Emergency Medicine

## 2013-07-30 ENCOUNTER — Emergency Department (HOSPITAL_COMMUNITY): Payer: Medicare Other

## 2013-07-30 DIAGNOSIS — Y929 Unspecified place or not applicable: Secondary | ICD-10-CM | POA: Insufficient documentation

## 2013-07-30 DIAGNOSIS — Z8669 Personal history of other diseases of the nervous system and sense organs: Secondary | ICD-10-CM | POA: Insufficient documentation

## 2013-07-30 DIAGNOSIS — Y9389 Activity, other specified: Secondary | ICD-10-CM | POA: Insufficient documentation

## 2013-07-30 DIAGNOSIS — IMO0002 Reserved for concepts with insufficient information to code with codable children: Secondary | ICD-10-CM | POA: Insufficient documentation

## 2013-07-30 DIAGNOSIS — Z950 Presence of cardiac pacemaker: Secondary | ICD-10-CM | POA: Insufficient documentation

## 2013-07-30 DIAGNOSIS — F17200 Nicotine dependence, unspecified, uncomplicated: Secondary | ICD-10-CM | POA: Insufficient documentation

## 2013-07-30 DIAGNOSIS — S60229A Contusion of unspecified hand, initial encounter: Secondary | ICD-10-CM | POA: Insufficient documentation

## 2013-07-30 DIAGNOSIS — S60221A Contusion of right hand, initial encounter: Secondary | ICD-10-CM

## 2013-07-30 DIAGNOSIS — Z8659 Personal history of other mental and behavioral disorders: Secondary | ICD-10-CM | POA: Insufficient documentation

## 2013-07-30 DIAGNOSIS — Z880 Allergy status to penicillin: Secondary | ICD-10-CM | POA: Insufficient documentation

## 2013-07-30 MED ORDER — HYDROCODONE-ACETAMINOPHEN 5-325 MG PO TABS: 1.0000 | ORAL_TABLET | ORAL | Status: DC | PRN

## 2013-07-30 MED ORDER — HYDROCODONE-ACETAMINOPHEN 5-325 MG PO TABS
1.0000 | ORAL_TABLET | Freq: Once | ORAL | Status: AC
  Administered 2013-07-30: 1 via ORAL
  Filled 2013-07-30: qty 1

## 2013-07-30 NOTE — ED Notes (Signed)
Patient states was helping a friend lay a floor and his friend missed the nail and hit the top of his right hand.  Patient has mild swelling noted to right hand.

## 2013-08-01 NOTE — ED Provider Notes (Signed)
CSN: [id number]     Arrival date & time 07/30/13  2059 History   First MD Initiated Contact with Patient 07/30/13 2120     Chief Complaint  Patient presents with  . Hand Injury   (Consider location/radiation/quality/duration/timing/severity/associated sxs/prior Treatment) HPI Comments: Isaac Jensen is a 47 y.o. Male presenting with pain and swelling of his right hand after being accidentally hit with a hammer.  Pain is constant and worse with flexing and extending the fingers.  He has normal sensation in the fingers,  But has decreased sensation at the site of the injury.  He has had no treatments prior to arrival.     The history is provided by the patient.    Past Medical History  Diagnosis Date  . Seizures   . Chronic pain   . Pacemaker   . Syncope   . Syncope   . Depression    Past Surgical History  Procedure Laterality Date  . Back surgery    . Neck surgery    . Knee arthroscopy    . Insert / replace / remove pacemaker    . Shoulder surgery     Family History  Problem Relation Age of Onset  . Hypertension Brother   . Heart attack Brother    History  Substance Use Topics  . Smoking status: Current Every Day Smoker -- 1.00 packs/day for 30 years    Types: Cigarettes  . Smokeless tobacco: Never Used  . Alcohol Use: No    Review of Systems  Constitutional: Negative for fever.  Musculoskeletal: Positive for joint swelling and arthralgias. Negative for myalgias.  Neurological: Negative for weakness and numbness.    Allergies  Ketorolac tromethamine; Sulfa drugs cross reactors; Tramadol; Penicillins; and Trazodone and nefazodone  Home Medications   Current Outpatient Rx  Name  Route  Sig  Dispense  Refill  . albuterol (PROVENTIL HFA;VENTOLIN HFA) 108 (90 BASE) MCG/ACT inhaler   Inhalation   Inhale 2 puffs into the lungs every 4 (four) hours as needed for wheezing.   1 Inhaler   0   . HYDROcodone-acetaminophen (NORCO/VICODIN) 5-325 MG per tablet    Oral   Take 1 tablet by mouth every 4 (four) hours as needed for pain.   20 tablet   0    BP 107/70  Pulse 85  Temp(Src) 98.3 F (36.8 C) (Oral)  Resp 20  Ht 5\' 10"  (1.778 m)  Wt 160 lb (72.576 kg)  BMI 22.96 kg/m2  SpO2 100% Physical Exam  Constitutional: He appears well-developed and well-nourished.  HENT:  Head: Atraumatic.  Neck: Normal range of motion.  Cardiovascular:  Pulses equal bilaterally  Musculoskeletal: He exhibits edema and tenderness.       Right hand: He exhibits tenderness, bony tenderness and swelling. He exhibits normal capillary refill and no deformity. Normal strength noted.       Hands: Neurological: He is alert. He has normal strength. He displays normal reflexes. No sensory deficit.  Equal strength  Skin: Skin is warm and dry.  Psychiatric: He has a normal mood and affect.    ED Course  Procedures (including critical care time) Labs Review Labs Reviewed - No data to display Imaging Review Dg Hand Complete Right  07/30/2013   *RADIOLOGY REPORT*  Clinical Data: Right hand injury  RIGHT HAND - COMPLETE 3+ VIEW  Comparison: August 27, 2007.  Findings: No fracture or dislocation is noted.  Joint spaces are intact. No soft tissue abnormality is noted.  IMPRESSION:  Normal right hand.   Original Report Authenticated By: Lupita Raider.,  M.D.    MDM   1. Hand contusion, right, initial encounter    Patients labs and/or radiological studies were viewed and considered during the medical decision making and disposition process. Recommended RICE, jones dressing applied,  Hydrocodone prescribed.    Burgess Amor, PA-C 08/01/13 1217

## 2013-08-03 NOTE — ED Provider Notes (Signed)
Medical screening examination/treatment/procedure(s) were performed by non-physician practitioner and as supervising physician I was immediately available for consultation/collaboration.  Glynn Octave, MD 08/03/13 [phone]

## 2013-09-05 ENCOUNTER — Emergency Department (HOSPITAL_COMMUNITY)
Admission: EM | Admit: 2013-09-05 | Discharge: 2013-09-05 | Disposition: A | Discharge status: Home / Self Care | Payer: Medicare HMO | Attending: Emergency Medicine | Admitting: Emergency Medicine

## 2013-09-05 ENCOUNTER — Encounter (HOSPITAL_COMMUNITY): Payer: Self-pay | Admitting: Emergency Medicine

## 2013-09-05 DIAGNOSIS — Z87891 Personal history of nicotine dependence: Secondary | ICD-10-CM | POA: Insufficient documentation

## 2013-09-05 DIAGNOSIS — M545 Low back pain, unspecified: Secondary | ICD-10-CM | POA: Insufficient documentation

## 2013-09-05 DIAGNOSIS — Z880 Allergy status to penicillin: Secondary | ICD-10-CM | POA: Insufficient documentation

## 2013-09-05 DIAGNOSIS — Z8669 Personal history of other diseases of the nervous system and sense organs: Secondary | ICD-10-CM | POA: Insufficient documentation

## 2013-09-05 DIAGNOSIS — Z950 Presence of cardiac pacemaker: Secondary | ICD-10-CM | POA: Insufficient documentation

## 2013-09-05 DIAGNOSIS — R52 Pain, unspecified: Secondary | ICD-10-CM | POA: Insufficient documentation

## 2013-09-05 DIAGNOSIS — Z79899 Other long term (current) drug therapy: Secondary | ICD-10-CM | POA: Insufficient documentation

## 2013-09-05 DIAGNOSIS — IMO0002 Reserved for concepts with insufficient information to code with codable children: Secondary | ICD-10-CM | POA: Insufficient documentation

## 2013-09-05 DIAGNOSIS — G8929 Other chronic pain: Secondary | ICD-10-CM

## 2013-09-05 DIAGNOSIS — Z8659 Personal history of other mental and behavioral disorders: Secondary | ICD-10-CM | POA: Insufficient documentation

## 2013-09-05 MED ORDER — OXYCODONE-ACETAMINOPHEN 5-325 MG PO TABS: 1.0000 | ORAL_TABLET | ORAL | Status: DC | PRN

## 2013-09-05 MED ORDER — CYCLOBENZAPRINE HCL 5 MG PO TABS: 5.0000 mg | ORAL_TABLET | Freq: Three times a day (TID) | ORAL | Status: DC | PRN

## 2013-09-05 MED ORDER — PREDNISONE 10 MG PO TABS: ORAL_TABLET | ORAL | Status: DC

## 2013-09-05 NOTE — ED Notes (Signed)
Patient w/history of chronic lumbar spine pain L4-S1.  Is to see Dr. Manson Passey w/[facility] on 11/6, but pain has become severe, keeping him awake at night.  Pain 9/10 radiating down both legs.  Taking advil, BC arthritis formula, heat and ice.

## 2013-09-06 NOTE — ED Provider Notes (Signed)
CSN: [id number]     Arrival date & time 09/05/13  1053 History   First MD Initiated Contact with Patient 09/05/13 1100     Chief Complaint  Patient presents with  . Back Pain   (Consider location/radiation/quality/duration/timing/severity/associated sxs/prior Treatment) HPI Comments: Isaac Jensen is a 47 y.o. Male presenting with acute on chronic low back pain which is constant daily pain,  But worsening over the past several months, now to the point of keeping him up at night.    Patient denies any new injury specifically.  There is radiation into his bilateral lateral thighs  There has been no weakness or numbness in the lower extremities and no urinary or bowel retention or incontinence.  Patient does not have a history of cancer or IVDU. He does have a history of prior lumbar surgery and is scheduled to followup with his surgeon at [facility] Dr Manson Passey on 11/6.       The history is provided by the patient.    Past Medical History  Diagnosis Date  . Seizures   . Chronic pain   . Pacemaker   . Syncope   . Syncope   . Depression    Past Surgical History  Procedure Laterality Date  . Back surgery    . Neck surgery    . Knee arthroscopy    . Insert / replace / remove pacemaker    . Shoulder surgery    . Shoulder surgery     Family History  Problem Relation Age of Onset  . Hypertension Brother   . Heart attack Brother    History  Substance Use Topics  . Smoking status: Former Smoker -- 30 years  . Smokeless tobacco: Never Used  . Alcohol Use: No    Review of Systems  Constitutional: Negative for fever.  Respiratory: Negative for shortness of breath.   Cardiovascular: Negative for chest pain and leg swelling.  Gastrointestinal: Negative for abdominal pain, constipation and abdominal distention.  Genitourinary: Negative for dysuria, urgency, frequency, flank pain and difficulty urinating.  Musculoskeletal: Positive for back pain. Negative for gait problem and joint  swelling.  Skin: Negative for rash.  Neurological: Negative for weakness and numbness.    Allergies  Ketorolac tromethamine; Sulfa drugs cross reactors; Tramadol; Penicillins; and Trazodone and nefazodone  Home Medications   Current Outpatient Rx  Name  Route  Sig  Dispense  Refill  . Aspirin-Salicylamide-Caffeine (BC HEADACHE POWDER PO)   Oral   Take 2 packets by mouth 4 (four) times daily as needed (pain).         Marland Kitchen ibuprofen (ADVIL,MOTRIN) 200 MG tablet   Oral   Take 800 mg by mouth every 6 (six) hours as needed for pain.         Marland Kitchen albuterol (PROVENTIL HFA;VENTOLIN HFA) 108 (90 BASE) MCG/ACT inhaler   Inhalation   Inhale 2 puffs into the lungs every 4 (four) hours as needed for wheezing.   1 Inhaler   0   . cyclobenzaprine (FLEXERIL) 5 MG tablet   Oral   Take 1 tablet (5 mg total) by mouth 3 (three) times daily as needed for muscle spasms.   15 tablet   0   . oxyCODONE-acetaminophen (PERCOCET/ROXICET) 5-325 MG per tablet   Oral   Take 1 tablet by mouth every 4 (four) hours as needed for pain.   20 tablet   0   . predniSONE (DELTASONE) 10 MG tablet      6, 5, 4, 3,  2 then 1 tablet by mouth daily for 6 days total.   21 tablet   0    BP 115/89  Pulse 70  Temp(Src) 98 F (36.7 C) (Oral)  Resp 14  Ht 5\' 10"  (1.778 m)  Wt 170 lb (77.111 kg)  BMI 24.39 kg/m2  SpO2 100% Physical Exam  Nursing note and vitals reviewed. Constitutional: He appears well-developed and well-nourished.  HENT:  Head: Normocephalic.  Eyes: Conjunctivae are normal.  Neck: Normal range of motion. Neck supple.  Cardiovascular: Normal rate and intact distal pulses.   Pedal pulses normal.  Pulmonary/Chest: Effort normal.  Abdominal: Soft. Bowel sounds are normal. He exhibits no distension and no mass.  Musculoskeletal: Normal range of motion. He exhibits no edema.       Lumbar back: He exhibits tenderness. He exhibits no swelling, no edema and no spasm.  Bilateral and midline lower  back tenderness.  Neurological: He is alert. He has normal strength. He displays no atrophy and no tremor. No sensory deficit. Gait normal.  Reflex Scores:      Patellar reflexes are 2+ on the right side and 2+ on the left side.      Achilles reflexes are 2+ on the right side and 2+ on the left side. No strength deficit noted in hip and knee flexor and extensor muscle groups.  Ankle flexion and extension intact.  Skin: Skin is warm and dry.  Psychiatric: He has a normal mood and affect.    ED Course  Procedures (including critical care time) Labs Review Labs Reviewed - No data to display Imaging Review No results found.  EKG Interpretation   None       MDM   1. Chronic lower back pain    No neuro deficit on exam or by history to suggest emergent or surgical presentation.  Also discussed worsened sx that should prompt immediate re-evaluation including distal weakness, bowel/bladder retention/incontinence. He was prescribed prednisone taper and flexeril,  Given small course of oxycodone - advised pt this this script must last until seen by his neurosurgeon at [facility] on 11/6.  Pt understands plan.           Burgess Amor, PA-C 09/06/13 [phone]

## 2013-09-07 NOTE — ED Provider Notes (Signed)
Medical screening examination/treatment/procedure(s) were performed by non-physician practitioner and as supervising physician I was immediately available for consultation/collaboration.  EKG Interpretation   None         Benny Lennert, MD 09/07/13 [phone]

## 2013-09-28 ENCOUNTER — Encounter (HOSPITAL_COMMUNITY): Payer: Self-pay | Admitting: Emergency Medicine

## 2013-09-28 ENCOUNTER — Emergency Department (HOSPITAL_COMMUNITY)
Admission: EM | Admit: 2013-09-28 | Discharge: 2013-09-28 | Disposition: A | Discharge status: Home / Self Care | Payer: Medicare HMO | Attending: Emergency Medicine | Admitting: Emergency Medicine

## 2013-09-28 DIAGNOSIS — Z880 Allergy status to penicillin: Secondary | ICD-10-CM | POA: Insufficient documentation

## 2013-09-28 DIAGNOSIS — Z8659 Personal history of other mental and behavioral disorders: Secondary | ICD-10-CM | POA: Insufficient documentation

## 2013-09-28 DIAGNOSIS — G8929 Other chronic pain: Secondary | ICD-10-CM | POA: Insufficient documentation

## 2013-09-28 DIAGNOSIS — Z950 Presence of cardiac pacemaker: Secondary | ICD-10-CM | POA: Insufficient documentation

## 2013-09-28 DIAGNOSIS — H9201 Otalgia, right ear: Secondary | ICD-10-CM

## 2013-09-28 DIAGNOSIS — Z79899 Other long term (current) drug therapy: Secondary | ICD-10-CM | POA: Insufficient documentation

## 2013-09-28 DIAGNOSIS — Z87891 Personal history of nicotine dependence: Secondary | ICD-10-CM | POA: Insufficient documentation

## 2013-09-28 DIAGNOSIS — J029 Acute pharyngitis, unspecified: Secondary | ICD-10-CM | POA: Insufficient documentation

## 2013-09-28 DIAGNOSIS — Z8669 Personal history of other diseases of the nervous system and sense organs: Secondary | ICD-10-CM | POA: Insufficient documentation

## 2013-09-28 DIAGNOSIS — H9209 Otalgia, unspecified ear: Secondary | ICD-10-CM | POA: Insufficient documentation

## 2013-09-28 MED ORDER — CLARITHROMYCIN 250 MG PO TABS: ORAL_TABLET | ORAL | Status: DC

## 2013-09-28 MED ORDER — ANTIPYRINE-BENZOCAINE 5.4-1.4 % OT SOLN
3.0000 [drp] | Freq: Once | OTIC | Status: AC
  Administered 2013-09-28: 4 [drp] via OTIC
  Filled 2013-09-28: qty 10

## 2013-09-28 MED ORDER — CLARITHROMYCIN 500 MG PO TABS
500.0000 mg | ORAL_TABLET | Freq: Once | ORAL | Status: AC
  Administered 2013-09-28: 500 mg via ORAL
  Filled 2013-09-28: qty 1

## 2013-09-28 MED ORDER — DEXAMETHASONE SODIUM PHOSPHATE 4 MG/ML IJ SOLN
8.0000 mg | Freq: Once | INTRAMUSCULAR | Status: AC
  Administered 2013-09-28: 8 mg via INTRAMUSCULAR
  Filled 2013-09-28: qty 2

## 2013-09-28 NOTE — ED Provider Notes (Signed)
CSN: [id number]     Arrival date & time 09/28/13  1940 History   First MD Initiated Contact with Patient 09/28/13 2227     Chief Complaint  Patient presents with  . Otalgia   (Consider location/radiation/quality/duration/timing/severity/associated sxs/prior Treatment) Patient is a 47 y.o. male presenting with ear pain. The history is provided by the patient.  Otalgia Location:  Right Behind ear:  No abnormality Quality:  Sharp Severity:  Severe Onset quality:  Gradual Duration:  3 days Timing:  Constant Progression:  Worsening Context: not direct blow and not foreign body in ear   Relieved by:  Nothing Worsened by:  Palpation Associated symptoms: rhinorrhea and sore throat   Associated symptoms: no abdominal pain, no cough, no fever and no neck pain     Past Medical History  Diagnosis Date  . Seizures   . Chronic pain   . Pacemaker   . Syncope   . Syncope   . Depression    Past Surgical History  Procedure Laterality Date  . Back surgery    . Neck surgery    . Knee arthroscopy    . Insert / replace / remove pacemaker    . Shoulder surgery    . Shoulder surgery     Family History  Problem Relation Age of Onset  . Hypertension Brother   . Heart attack Brother    History  Substance Use Topics  . Smoking status: Former Smoker -- 30 years  . Smokeless tobacco: Never Used  . Alcohol Use: No    Review of Systems  Constitutional: Negative for fever and activity change.       All ROS Neg except as noted in HPI  HENT: Positive for ear pain, rhinorrhea and sore throat. Negative for nosebleeds.   Eyes: Negative for photophobia and discharge.  Respiratory: Negative for cough, shortness of breath and wheezing.   Cardiovascular: Negative for chest pain and palpitations.  Gastrointestinal: Negative for abdominal pain and blood in stool.  Genitourinary: Negative for dysuria, frequency and hematuria.  Musculoskeletal: Negative for arthralgias, back pain and neck pain.   Skin: Negative.   Neurological: Negative for dizziness, seizures and speech difficulty.  Psychiatric/Behavioral: Negative for hallucinations and confusion.    Allergies  Ketorolac tromethamine; Sulfa drugs cross reactors; Tramadol; Penicillins; and Trazodone and nefazodone  Home Medications   Current Outpatient Rx  Name  Route  Sig  Dispense  Refill  . Aspirin-Salicylamide-Caffeine (BC HEADACHE POWDER PO)   Oral   Take 2 packets by mouth 4 (four) times daily as needed (pain).         Marland Kitchen ibuprofen (ADVIL,MOTRIN) 200 MG tablet   Oral   Take 800 mg by mouth every 6 (six) hours as needed for pain.         Marland Kitchen albuterol (PROVENTIL HFA;VENTOLIN HFA) 108 (90 BASE) MCG/ACT inhaler   Inhalation   Inhale 2 puffs into the lungs every 4 (four) hours as needed for wheezing.   1 Inhaler   0   . HYDROcodone-acetaminophen (NORCO) 10-325 MG per tablet   Oral   Take 1 tablet by mouth every 4 (four) hours as needed. For pain          BP 128/86  Pulse 77  Temp(Src) 98.4 F (36.9 C) (Oral)  Resp 16  Ht 5\' 10"  (1.778 m)  Wt 170 lb (77.111 kg)  BMI 24.39 kg/m2  SpO2 100% Physical Exam  Nursing note and vitals reviewed. Constitutional: He is oriented to person, place,  and time. He appears well-developed and well-nourished.  Non-toxic appearance.  HENT:  Head: Normocephalic.  Right Ear: Tympanic membrane normal.  Left Ear: Tympanic membrane and external ear normal.  There is pain and soreness with movement of the external ear. Mild to mod swelling and redness of the EAC. No drainage. Mild to mod increase redness of the posterior pharynx and mild swelling of the uvula. Airway patent.  Eyes: EOM and lids are normal. Pupils are equal, round, and reactive to light.  Neck: Normal range of motion. Neck supple. Carotid bruit is not present.  Cardiovascular: Normal rate, regular rhythm, normal heart sounds, intact distal pulses and normal pulses.   Pulmonary/Chest: Breath sounds normal. No  respiratory distress.  Abdominal: Soft. Bowel sounds are normal. There is no tenderness. There is no guarding.  Musculoskeletal: Normal range of motion.  Lymphadenopathy:       Head (right side): No submandibular adenopathy present.       Head (left side): No submandibular adenopathy present.    He has no cervical adenopathy.  Neurological: He is alert and oriented to person, place, and time. He has normal strength. No cranial nerve deficit or sensory deficit.  Skin: Skin is warm and dry.  Psychiatric: He has a normal mood and affect. His speech is normal.    ED Course  Procedures (including critical care time) Labs Review Labs Reviewed - No data to display Imaging Review No results found.  EKG Interpretation   None       MDM  No diagnosis found. **I have reviewed nursing notes, vital signs, and all appropriate lab and imaging results for this patient.*  Exam is consistent with otitis externa and pharyngitis. Pt on hydrocodone for pcp for pain. Pt advised to use salt water gargles and biaxin . Pt to see primary MD for recheck if not improving.  Kathie Dike, PA-C 09/29/13 1436

## 2013-09-28 NOTE — ED Notes (Signed)
Pt reporting earache on right side that began on Friday.

## 2013-09-28 NOTE — ED Notes (Signed)
Rt ear pain since Friday.  With sore throat.  Alert, NAD.

## 2013-10-02 NOTE — ED Provider Notes (Signed)
Medical screening examination/treatment/procedure(s) were performed by non-physician practitioner and as supervising physician I was immediately available for consultation/collaboration.  EKG Interpretation   None         Kamonte Mcmichen M Chantele Corado, MD 10/02/13 0039 

## 2013-10-16 ENCOUNTER — Ambulatory Visit (INDEPENDENT_AMBULATORY_CARE_PROVIDER_SITE_OTHER): Payer: Medicare HMO | Admitting: *Deleted

## 2013-10-16 ENCOUNTER — Encounter: Payer: Self-pay | Admitting: Cardiology

## 2013-10-16 ENCOUNTER — Ambulatory Visit (INDEPENDENT_AMBULATORY_CARE_PROVIDER_SITE_OTHER): Payer: Medicare HMO | Admitting: Cardiology

## 2013-10-16 VITALS — BP 129/88 | HR 72 | Ht 70.0 in | Wt 169.0 lb

## 2013-10-16 DIAGNOSIS — R55 Syncope and collapse: Secondary | ICD-10-CM

## 2013-10-16 DIAGNOSIS — R001 Bradycardia, unspecified: Secondary | ICD-10-CM

## 2013-10-16 DIAGNOSIS — I498 Other specified cardiac arrhythmias: Secondary | ICD-10-CM

## 2013-10-16 LAB — MDC_IDC_ENUM_SESS_TYPE_INCLINIC
Battery Voltage: 3 V
Brady Statistic RV Percent Paced: 0.3 %
Lead Channel Impedance Value: 504 Ohm

## 2013-10-16 NOTE — Assessment & Plan Note (Signed)
Status post Medtronic Revo pacemaker in 2011 per [facility]. Records reviewed. Will have device interrogated today and then establish him in the device clinic going forward.

## 2013-10-16 NOTE — Patient Instructions (Addendum)
Your physician wants you to follow-up in: 8 MONTHS WITH Dr. Randa Spike will receive a reminder letter in the mail two months in advance. If you don't receive a letter, please call our office to schedule the follow-up appointment.  Your physician recommends that you schedule a follow-up appointment in: 3 MONTHS WITH Dr. Lewayne Bunting

## 2013-10-16 NOTE — Assessment & Plan Note (Signed)
Symptomatically stable. We discussed basic measures, mainly maintaining adequate hydration status. No change to current regimen.

## 2013-10-16 NOTE — Progress Notes (Signed)
PPM check in office. 

## 2013-10-16 NOTE — Progress Notes (Signed)
Clinical Summary Mr. Tetrault is a 47 y.o.male presenting to establish followup, a former patient of [facility] that was last seen in 2012. 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  He reports no recurring episodes of syncope, sometimes feels somewhat dizzy when he stands up abruptly. No palpitations or chest pain. He has not had his device interrogated since 2012.  Echocardiogram in October 2013 reported LVEF 55-60% with normal diastolic parameters, device wire in the right heart, Chiari network in the right atrium, mild tricuspid regurgitation.   Allergies  Allergen Reactions  . Ketorolac Tromethamine Shortness Of Breath  . Sulfa Drugs Cross Reactors Shortness Of Breath  . Tramadol Shortness Of Breath  . Penicillins Nausea And Vomiting  . Trazodone And Nefazodone Other (See Comments)    REACTION: ALTERS MENTAL STATUS    Current Outpatient Prescriptions  Medication Sig Dispense Refill  . albuterol (PROVENTIL HFA;VENTOLIN HFA) 108 (90 BASE) MCG/ACT inhaler Inhale 2 puffs into the lungs every 4 (four) hours as needed for wheezing.  1 Inhaler  0  . Aspirin-Salicylamide-Caffeine (BC HEADACHE POWDER PO) Take 2 packets by mouth 4 (four) times daily as needed (pain).      Marland Kitchen HYDROcodone-acetaminophen (NORCO) 10-325 MG per tablet Take 1 tablet by mouth every 4 (four) hours as needed. For pain      . ibuprofen (ADVIL,MOTRIN) 200 MG tablet Take 800 mg by mouth every 6 (six) hours as needed for pain.       No current facility-administered medications for this visit.    Past Medical History  Diagnosis Date  . History of seizures   . Chronic pain   . Status post placement of cardiac pacemaker     [facility] - Medtronic Revo  . Vasovagal syncope   . Depression   . History of cardiac catheterization     Normal coronaries 2009     Past Surgical History  Procedure Laterality Date  . Back surgery    . Neck surgery    . Knee arthroscopy    . Pacemaker insertion      Medtronic Revo December 2011 [facility]  . Shoulder surgery      Social History Mr. Prats reports that he has quit smoking. His smoking use included Cigarettes. He smoked 0.00 packs per day for 30 years. He has never used smokeless tobacco. Mr. Hinderliter reports that he does not drink alcohol.  Review of Systems Negative except as outlined.  Physical Examination Filed Vitals:   10/16/13 1050  BP: 129/88  Pulse: 72   Filed Weights   10/16/13 1050  Weight: 169 lb (76.658 kg)   Patient appears comfortable at rest. HEENT: Conjunctiva and lids normal, oropharynx clear. Neck: Supple, no elevated JVP or carotid bruits, no thyromegaly. Lungs: Clear to auscultation, nonlabored breathing at rest. Thorax: Well-healed right upper chest device pocket site. Cardiac: Regular rate and rhythm, no S3 or significant systolic murmur, no pericardial rub. Abdomen: Soft, nontender, bowel sounds present. Extremities: No pitting edema, distal pulses 2+. Skin: Warm and dry. Musculoskeletal: No kyphosis. Neuropsychiatric: Alert and oriented x3, affect grossly appropriate.   Problem List and Plan   Vasovagal syncope Symptomatically stable. We discussed basic measures, mainly maintaining adequate hydration status. No change to current regimen.  Symptomatic sinus bradycardia Status post Medtronic Revo pacemaker in 2011 per [facility]. Records reviewed. Will have device interrogated today and then establish him  in the device clinic going forward.    Jonelle Sidle, M.D., F.A.C.C.

## 2013-11-04 ENCOUNTER — Encounter: Payer: Self-pay | Admitting: Internal Medicine

## 2013-11-08 ENCOUNTER — Encounter (HOSPITAL_COMMUNITY): Payer: Self-pay | Admitting: Emergency Medicine

## 2013-11-08 ENCOUNTER — Emergency Department (HOSPITAL_COMMUNITY)
Admission: EM | Admit: 2013-11-08 | Discharge: 2013-11-09 | Disposition: A | Discharge status: Home / Self Care | Payer: Medicare HMO | Attending: Emergency Medicine | Admitting: Emergency Medicine

## 2013-11-08 ENCOUNTER — Emergency Department (HOSPITAL_COMMUNITY): Payer: Medicare HMO

## 2013-11-08 DIAGNOSIS — Z95818 Presence of other cardiac implants and grafts: Secondary | ICD-10-CM | POA: Insufficient documentation

## 2013-11-08 DIAGNOSIS — Z87891 Personal history of nicotine dependence: Secondary | ICD-10-CM | POA: Insufficient documentation

## 2013-11-08 DIAGNOSIS — R1110 Vomiting, unspecified: Secondary | ICD-10-CM | POA: Insufficient documentation

## 2013-11-08 DIAGNOSIS — Z79899 Other long term (current) drug therapy: Secondary | ICD-10-CM | POA: Insufficient documentation

## 2013-11-08 DIAGNOSIS — Z8659 Personal history of other mental and behavioral disorders: Secondary | ICD-10-CM | POA: Insufficient documentation

## 2013-11-08 DIAGNOSIS — Z950 Presence of cardiac pacemaker: Secondary | ICD-10-CM | POA: Insufficient documentation

## 2013-11-08 DIAGNOSIS — R079 Chest pain, unspecified: Secondary | ICD-10-CM

## 2013-11-08 DIAGNOSIS — Z880 Allergy status to penicillin: Secondary | ICD-10-CM | POA: Insufficient documentation

## 2013-11-08 DIAGNOSIS — R0789 Other chest pain: Secondary | ICD-10-CM | POA: Insufficient documentation

## 2013-11-08 DIAGNOSIS — G8929 Other chronic pain: Secondary | ICD-10-CM | POA: Insufficient documentation

## 2013-11-08 LAB — CBC WITH DIFFERENTIAL/PLATELET
Basophils Absolute: 0 10*3/uL (ref 0.0–0.1)
Basophils Relative: 0 % (ref 0–1)
Eosinophils Absolute: 0.1 10*3/uL (ref 0.0–0.7)
HCT: 43.1 % (ref 39.0–52.0)
MCHC: 34.1 g/dL (ref 30.0–36.0)
Monocytes Absolute: 0.7 10*3/uL (ref 0.1–1.0)
Neutro Abs: 3.9 10*3/uL (ref 1.7–7.7)
Neutrophils Relative %: 57 % (ref 43–77)
RDW: 14.4 % (ref 11.5–15.5)

## 2013-11-08 LAB — COMPREHENSIVE METABOLIC PANEL
AST: 33 U/L (ref 0–37)
Albumin: 4.4 g/dL (ref 3.5–5.2)
Chloride: 102 mEq/L (ref 96–112)
Creatinine, Ser: 0.98 mg/dL (ref 0.50–1.35)
Total Bilirubin: 0.6 mg/dL (ref 0.3–1.2)
Total Protein: 8 g/dL (ref 6.0–8.3)

## 2013-11-08 LAB — TROPONIN I: Troponin I: <0.3 ng/mL (ref <0.30)

## 2013-11-08 MED ORDER — HYDROCODONE-ACETAMINOPHEN 5-325 MG PO TABS
1.0000 | ORAL_TABLET | Freq: Once | ORAL | Status: AC
  Administered 2013-11-08: 1 via ORAL
  Filled 2013-11-08: qty 1

## 2013-11-08 NOTE — ED Provider Notes (Signed)
CSN: [id number]     Arrival date & time 11/08/13  2042 History  This chart was scribed for Benny Lennert, MD, by Yevette Edwards, ED Scribe. This patient was seen in room APA10/APA10 and the patient's care was started at 9:46 PM.  First MD Initiated Contact with Patient 11/08/13 2145     Chief Complaint  Patient presents with  . Abdominal Pain   Patient is a 47 y.o. male presenting with abdominal pain. The history is provided by the patient. No language interpreter was used.  Abdominal Pain Pain location:  Epigastric Pain radiates to:  Does not radiate Pain severity:  Moderate (5/10) Onset quality:  Unable to specify Duration:  1 day Timing:  Intermittent Progression:  Partially resolved Chronicity:  New Relieved by:  Nothing Worsened by:  Nothing tried Ineffective treatments:  None tried Associated symptoms: vomiting   Associated symptoms: no fever   Risk factors: multiple surgeries    HPI Comments: Isaac Jensen is a 47 y.o. male, with a h/o cardiac pacemaker and chronic pain, who presents to the Emergency Department complaining of gradually-increasing epigastric pain which began this morning and has been associated with emesis and clamminess. The pt rates the pain as 5/10, and he reports the pain is increased with deep inspiration. At bedside, the pain has subsided to a degree. He denies abdominal surgery. The pt is a former smoker.   Dr. Marcelene Butte is the PCP.   Past Medical History  Diagnosis Date  . History of seizures   . Chronic pain   . Status post placement of cardiac pacemaker     [facility] - Medtronic Revo  . Vasovagal syncope   . Depression   . History of cardiac catheterization     Normal coronaries 2009   Past Surgical History  Procedure Laterality Date  . Back surgery    . Neck surgery    . Knee arthroscopy    . Pacemaker insertion      Medtronic Revo December 2011 [facility]  . Shoulder surgery     Family History  Problem Relation Age of Onset  .  Hypertension Brother   . Heart attack Brother    History  Substance Use Topics  . Smoking status: Former Smoker -- 30 years    Types: Cigarettes  . Smokeless tobacco: Never Used  . Alcohol Use: No    Review of Systems  Constitutional: Negative for fever.  Gastrointestinal: Positive for vomiting and abdominal pain.  All other systems reviewed and are negative.    Allergies  Ketorolac tromethamine; Sulfa drugs cross reactors; Tramadol; Penicillins; and Trazodone and nefazodone  Home Medications   Current Outpatient Rx  Name  Route  Sig  Dispense  Refill  . albuterol (PROVENTIL HFA;VENTOLIN HFA) 108 (90 BASE) MCG/ACT inhaler   Inhalation   Inhale 2 puffs into the lungs every 4 (four) hours as needed for wheezing.   1 Inhaler   0   . Aspirin-Salicylamide-Caffeine (BC HEADACHE POWDER PO)   Oral   Take 2 packets by mouth 4 (four) times daily as needed (pain).         Marland Kitchen ibuprofen (ADVIL,MOTRIN) 200 MG tablet   Oral   Take 800 mg by mouth every 6 (six) hours as needed for pain.          Triage Vitals: BP 110/81  Pulse 72  Temp(Src) 98 F (36.7 C) (Oral)  Resp 18  SpO2 99%  Physical Exam  Nursing note and vitals reviewed.  Constitutional: He is oriented to person, place, and time. He appears well-developed.  HENT:  Head: Normocephalic.  Eyes: Conjunctivae and EOM are normal. No scleral icterus.  Neck: Neck supple. No thyromegaly present.  Cardiovascular: Normal rate and regular rhythm.  Exam reveals no gallop and no friction rub.   No murmur heard. Pulmonary/Chest: No stridor. He has wheezes. He has no rales. He exhibits no tenderness.  Mild wheezing bilaterally.   Abdominal: He exhibits no distension. There is no tenderness. There is no rebound.  Musculoskeletal: Normal range of motion. He exhibits no edema.  Lymphadenopathy:    He has no cervical adenopathy.  Neurological: He is alert and oriented to person, place, and time. He exhibits normal muscle tone.  Coordination normal.  Skin: No rash noted. No erythema.  Psychiatric: He has a normal mood and affect. His behavior is normal.    ED Course  Procedures (including critical care time)  DIAGNOSTIC STUDIES: Oxygen Saturation is 99% on room air, normal by my interpretation.    COORDINATION OF CARE:  9:51 PM- Discussed treatment plan with patient, and the patient agreed to the plan.   Labs Review Labs Reviewed  D-DIMER, QUANTITATIVE - Abnormal; Notable for the following:    D-Dimer, Quant 0.58 (*)    All other components within normal limits  CBC WITH DIFFERENTIAL  COMPREHENSIVE METABOLIC PANEL  TROPONIN I   Imaging Review Dg Chest Portable 1 View  11/08/2013   CLINICAL DATA:  Chest pain.  Epigastric abdominal pain.  EXAM: PORTABLE CHEST - 1 VIEW  COMPARISON:  Two-view chest x-ray 03/19/2013, 06/20/2011.  FINDINGS: Cardiac silhouette normal in size, unchanged. Mild thoracic aortic atherosclerosis, unchanged. Right subclavian dual lead transvenous pacemaker unchanged and appears intact. Lungs clear. Bronchovascular markings normal. Pulmonary vascularity normal. No visible pleural effusions. No pneumothorax.  IMPRESSION: No acute cardiopulmonary disease.   Electronically Signed   By: Hulan Saas M.D.   On: 11/08/2013 22:26    EKG Interpretation    Date/Time:  Sunday November 08 2013 21:53:25 EST Ventricular Rate:  61 PR Interval:  170 QRS Duration: 80 QT Interval:  420 QTC Calculation: 422 R Axis:   73 Text Interpretation:  Atrial-paced rhythm Abnormal ECG When compared with ECG of 10-Sep-2012 11:15, Electronic atrial pacemaker has replaced Sinus rhythm Confirmed by Macklin Jacquin  MD, Jaishawn Witzke ([id number]) on 11/09/2013 12:34:33 AM            MDM  Chest pain nl tests.  Pt to follow up with pcp                     The chart was scribed for me under my direct supervision.  I personally performed the history, physical, and medical decision making and all procedures in the evaluation of  this patient.Benny Lennert, MD 11/09/13 [phone]

## 2013-11-08 NOTE — ED Notes (Signed)
Upper abd/epigastric pain onset 13 hours ago, vomited x 1

## 2013-11-09 MED ORDER — IOHEXOL 350 MG/ML SOLN
100.0000 mL | Freq: Once | INTRAVENOUS | Status: AC | PRN
  Administered 2013-11-09: 100 mL via INTRAVENOUS

## 2013-11-09 MED ORDER — HYDROCODONE-ACETAMINOPHEN 5-325 MG PO TABS: 1.0000 | ORAL_TABLET | Freq: Four times a day (QID) | ORAL | Status: DC | PRN

## 2013-12-10 ENCOUNTER — Inpatient Hospital Stay (HOSPITAL_COMMUNITY)
Admission: AD | Admit: 2013-12-10 | Discharge: 2013-12-14 | DRG: 885 | Disposition: A | Discharge status: Home / Self Care | Payer: 59 | Source: Intra-hospital | Attending: Psychiatry | Admitting: Psychiatry

## 2013-12-10 ENCOUNTER — Emergency Department (HOSPITAL_COMMUNITY)
Admission: EM | Admit: 2013-12-10 | Discharge: 2013-12-10 | Disposition: A | Discharge status: Home / Self Care | Payer: Medicare Other | Attending: Emergency Medicine | Admitting: Emergency Medicine

## 2013-12-10 ENCOUNTER — Encounter (HOSPITAL_COMMUNITY): Payer: Self-pay | Admitting: Emergency Medicine

## 2013-12-10 ENCOUNTER — Encounter (HOSPITAL_COMMUNITY): Payer: Self-pay | Admitting: General Practice

## 2013-12-10 ENCOUNTER — Emergency Department (HOSPITAL_COMMUNITY): Payer: Medicare Other

## 2013-12-10 DIAGNOSIS — F3289 Other specified depressive episodes: Secondary | ICD-10-CM | POA: Insufficient documentation

## 2013-12-10 DIAGNOSIS — T1491XA Suicide attempt, initial encounter: Secondary | ICD-10-CM

## 2013-12-10 DIAGNOSIS — F329 Major depressive disorder, single episode, unspecified: Secondary | ICD-10-CM | POA: Diagnosis not present

## 2013-12-10 DIAGNOSIS — Z950 Presence of cardiac pacemaker: Secondary | ICD-10-CM | POA: Diagnosis not present

## 2013-12-10 DIAGNOSIS — M1990 Unspecified osteoarthritis, unspecified site: Secondary | ICD-10-CM

## 2013-12-10 DIAGNOSIS — S065XAA Traumatic subdural hemorrhage with loss of consciousness status unknown, initial encounter: Secondary | ICD-10-CM

## 2013-12-10 DIAGNOSIS — Z880 Allergy status to penicillin: Secondary | ICD-10-CM | POA: Diagnosis not present

## 2013-12-10 DIAGNOSIS — G40909 Epilepsy, unspecified, not intractable, without status epilepticus: Secondary | ICD-10-CM

## 2013-12-10 DIAGNOSIS — R7989 Other specified abnormal findings of blood chemistry: Secondary | ICD-10-CM

## 2013-12-10 DIAGNOSIS — T71162A Asphyxiation due to hanging, intentional self-harm, initial encounter: Secondary | ICD-10-CM | POA: Insufficient documentation

## 2013-12-10 DIAGNOSIS — R45851 Suicidal ideations: Secondary | ICD-10-CM

## 2013-12-10 DIAGNOSIS — S065X9A Traumatic subdural hemorrhage with loss of consciousness of unspecified duration, initial encounter: Secondary | ICD-10-CM

## 2013-12-10 DIAGNOSIS — G4700 Insomnia, unspecified: Secondary | ICD-10-CM | POA: Diagnosis present

## 2013-12-10 DIAGNOSIS — G8929 Other chronic pain: Secondary | ICD-10-CM | POA: Diagnosis present

## 2013-12-10 DIAGNOSIS — E876 Hypokalemia: Secondary | ICD-10-CM

## 2013-12-10 DIAGNOSIS — Z008 Encounter for other general examination: Secondary | ICD-10-CM | POA: Diagnosis present

## 2013-12-10 DIAGNOSIS — F333 Major depressive disorder, recurrent, severe with psychotic symptoms: Principal | ICD-10-CM | POA: Diagnosis present

## 2013-12-10 DIAGNOSIS — Z87891 Personal history of nicotine dependence: Secondary | ICD-10-CM | POA: Diagnosis not present

## 2013-12-10 DIAGNOSIS — Z8249 Family history of ischemic heart disease and other diseases of the circulatory system: Secondary | ICD-10-CM

## 2013-12-10 DIAGNOSIS — R55 Syncope and collapse: Secondary | ICD-10-CM

## 2013-12-10 DIAGNOSIS — L0291 Cutaneous abscess, unspecified: Secondary | ICD-10-CM

## 2013-12-10 DIAGNOSIS — R001 Bradycardia, unspecified: Secondary | ICD-10-CM

## 2013-12-10 DIAGNOSIS — I951 Orthostatic hypotension: Secondary | ICD-10-CM

## 2013-12-10 DIAGNOSIS — M5090 Cervical disc disorder, unspecified, unspecified cervical region: Secondary | ICD-10-CM

## 2013-12-10 DIAGNOSIS — F411 Generalized anxiety disorder: Secondary | ICD-10-CM | POA: Diagnosis present

## 2013-12-10 DIAGNOSIS — Z9889 Other specified postprocedural states: Secondary | ICD-10-CM | POA: Insufficient documentation

## 2013-12-10 DIAGNOSIS — F32A Depression, unspecified: Secondary | ICD-10-CM

## 2013-12-10 DIAGNOSIS — X838XXA Intentional self-harm by other specified means, initial encounter: Secondary | ICD-10-CM | POA: Insufficient documentation

## 2013-12-10 DIAGNOSIS — Z720 Tobacco use: Secondary | ICD-10-CM

## 2013-12-10 DIAGNOSIS — Z79899 Other long term (current) drug therapy: Secondary | ICD-10-CM | POA: Diagnosis not present

## 2013-12-10 DIAGNOSIS — F331 Major depressive disorder, recurrent, moderate: Secondary | ICD-10-CM

## 2013-12-10 LAB — COMPREHENSIVE METABOLIC PANEL
ALK PHOS: 95 U/L (ref 39–117)
ALT: 7 U/L (ref 0–53)
AST: 15 U/L (ref 0–37)
Albumin: 4.1 g/dL (ref 3.5–5.2)
BILIRUBIN TOTAL: 0.3 mg/dL (ref 0.3–1.2)
BUN: 7 mg/dL (ref 6–23)
CHLORIDE: 105 meq/L (ref 96–112)
CO2: 23 meq/L (ref 19–32)
CREATININE: 0.85 mg/dL (ref 0.50–1.35)
Calcium: 9.4 mg/dL (ref 8.4–10.5)
GFR calc Af Amer: >90 mL/min (ref >90)
GLUCOSE: 101 mg/dL — AB (ref 70–99)
POTASSIUM: 3.3 meq/L — AB (ref 3.7–5.3)
Sodium: 143 mEq/L (ref 137–147)
Total Protein: 8.2 g/dL (ref 6.0–8.3)

## 2013-12-10 LAB — RAPID URINE DRUG SCREEN, HOSP PERFORMED
Amphetamines: NOT DETECTED
BARBITURATES: NOT DETECTED
BENZODIAZEPINES: NOT DETECTED
COCAINE: NOT DETECTED
Opiates: NOT DETECTED
Tetrahydrocannabinol: POSITIVE — AB

## 2013-12-10 LAB — CBC WITH DIFFERENTIAL/PLATELET
Basophils Absolute: 0 10*3/uL (ref 0.0–0.1)
Basophils Relative: 0 % (ref 0–1)
Eosinophils Absolute: 0.2 10*3/uL (ref 0.0–0.7)
Eosinophils Relative: 2 % (ref 0–5)
HCT: 45.1 % (ref 39.0–52.0)
Hemoglobin: 15.2 g/dL (ref 13.0–17.0)
LYMPHS ABS: 2.5 10*3/uL (ref 0.7–4.0)
Lymphocytes Relative: 34 % (ref 12–46)
MCH: 31.5 pg (ref 26.0–34.0)
MCHC: 33.7 g/dL (ref 30.0–36.0)
MCV: 93.4 fL (ref 78.0–100.0)
MONO ABS: 0.5 10*3/uL (ref 0.1–1.0)
MONOS PCT: 7 % (ref 3–12)
Neutro Abs: 4.2 10*3/uL (ref 1.7–7.7)
Neutrophils Relative %: 57 % (ref 43–77)
PLATELETS: 304 10*3/uL (ref 150–400)
RBC: 4.83 MIL/uL (ref 4.22–5.81)
RDW: 13.7 % (ref 11.5–15.5)
WBC: 7.3 10*3/uL (ref 4.0–10.5)

## 2013-12-10 LAB — ETHANOL: Alcohol, Ethyl (B): <11 mg/dL (ref 0–11)

## 2013-12-10 MED ORDER — ALUM & MAG HYDROXIDE-SIMETH 200-200-20 MG/5ML PO SUSP: 30.0000 mL | ORAL | Status: DC | PRN

## 2013-12-10 MED ORDER — ACETAMINOPHEN 325 MG PO TABS: 650.0000 mg | ORAL_TABLET | Freq: Four times a day (QID) | ORAL | Status: DC | PRN

## 2013-12-10 MED ORDER — LORAZEPAM 1 MG PO TABS: 1.0000 mg | ORAL_TABLET | Freq: Three times a day (TID) | ORAL | Status: DC | PRN

## 2013-12-10 MED ORDER — ACETAMINOPHEN 325 MG PO TABS: 650.0000 mg | ORAL_TABLET | ORAL | Status: DC | PRN

## 2013-12-10 MED ORDER — PREGABALIN 75 MG PO CAPS
300.0000 mg | ORAL_CAPSULE | Freq: Two times a day (BID) | ORAL | Status: DC
  Administered 2013-12-10: 300 mg via ORAL
  Filled 2013-12-10: qty 4

## 2013-12-10 MED ORDER — OXYCODONE-ACETAMINOPHEN 5-325 MG PO TABS
1.0000 | ORAL_TABLET | Freq: Once | ORAL | Status: AC
  Administered 2013-12-10: 1 via ORAL
  Filled 2013-12-10: qty 1

## 2013-12-10 MED ORDER — POTASSIUM CHLORIDE CRYS ER 20 MEQ PO TBCR
40.0000 meq | EXTENDED_RELEASE_TABLET | Freq: Once | ORAL | Status: AC
Start: 2013-12-10 — End: 2013-12-10
  Administered 2013-12-10: 40 meq via ORAL
  Filled 2013-12-10: qty 2

## 2013-12-10 MED ORDER — OXYCODONE-ACETAMINOPHEN 5-325 MG PO TABS
1.0000 | ORAL_TABLET | ORAL | Status: DC | PRN
  Administered 2013-12-10 (×3): 1 via ORAL
  Filled 2013-12-10 (×3): qty 1

## 2013-12-10 MED ORDER — ONDANSETRON HCL 4 MG PO TABS: 4.0000 mg | ORAL_TABLET | Freq: Three times a day (TID) | ORAL | Status: DC | PRN

## 2013-12-10 MED ORDER — PREGABALIN 100 MG PO CAPS
200.0000 mg | ORAL_CAPSULE | Freq: Once | ORAL | Status: AC
  Administered 2013-12-10: 200 mg via ORAL
  Filled 2013-12-10: qty 2

## 2013-12-10 MED ORDER — OLANZAPINE 10 MG PO TBDP
10.0000 mg | ORAL_TABLET | Freq: Three times a day (TID) | ORAL | Status: DC | PRN
  Administered 2013-12-11: 10 mg via ORAL
  Filled 2013-12-10: qty 1

## 2013-12-10 MED ORDER — MAGNESIUM HYDROXIDE 400 MG/5ML PO SUSP
30.0000 mL | Freq: Every day | ORAL | Status: DC | PRN
Start: 2013-12-10 — End: 2013-12-14

## 2013-12-10 MED ORDER — PREGABALIN 100 MG PO CAPS
100.0000 mg | ORAL_CAPSULE | Freq: Once | ORAL | Status: AC
  Administered 2013-12-10: 100 mg via ORAL
  Filled 2013-12-10: qty 1

## 2013-12-10 MED ORDER — ALBUTEROL SULFATE HFA 108 (90 BASE) MCG/ACT IN AERS: 2.0000 | INHALATION_SPRAY | RESPIRATORY_TRACT | Status: DC | PRN

## 2013-12-10 MED ORDER — HYDROXYZINE HCL 50 MG PO TABS
50.0000 mg | ORAL_TABLET | Freq: Every evening | ORAL | Status: DC | PRN
  Administered 2013-12-10 – 2013-12-13 (×4): 50 mg via ORAL
  Filled 2013-12-10 (×4): qty 1

## 2013-12-10 NOTE — Tx Team (Signed)
Initial Interdisciplinary Treatment Plan  PATIENT STRENGTHS: (choose at least two) Communication skills General fund of knowledge Supportive family/friends  PATIENT STRESSORS: Health problems Legal issue   PROBLEM LIST: Problem List/Patient Goals Date to be addressed Date deferred Reason deferred Estimated date of resolution                                                         DISCHARGE CRITERIA:  Improved stabilization in mood, thinking, and/or behavior Motivation to continue treatment in a less acute level of care Verbal commitment to aftercare and medication compliance  PRELIMINARY DISCHARGE PLAN: Attend aftercare/continuing care group Outpatient therapy  PATIENT/FAMIILY INVOLVEMENT: This treatment plan has been presented to and reviewed with the patient, Isaac Jensen.  The patient and family have been given the opportunity to ask questions and make suggestions.  Ned ClinesDopson, Charlette Hennings E 12/10/2013, 4:20 PM

## 2013-12-10 NOTE — BH Assessment (Signed)
[facility] Renata Capriceonrad, NP to Dr. Jannifer FranklinAkintayo. The room assignment is 403-1. Pt's nurse Claris Che(Margaret) will complete pt's support paperwork and coordinate transfer via [place].

## 2013-12-10 NOTE — ED Notes (Signed)
TTS consult scheduled for 0900.  Camera in the room.

## 2013-12-10 NOTE — Progress Notes (Signed)
Adult Psychoeducational Group Note  Date:  12/10/2013 Time:  10:55 PM  Group Topic/Focus:  Goals Group:   The focus of this group is to help patients establish daily goals to achieve during treatment and discuss how the patient can incorporate goal setting into their daily lives to aide in recovery.  Participation Level:  Active  Participation Quality:  Appropriate  Affect:  Appropriate  Cognitive:  Appropriate  Insight: Appropriate  Engagement in Group:  Engaged  Modes of Intervention:  Discussion  Additional Comments:  Pt stated that his day was pretty bad because he was tired didn't get enough rest. Pt then stated that he is interested on how the rest of his treatment will be.  Terie PurserParker, Royden Bulman R 12/10/2013, 10:55 PM

## 2013-12-10 NOTE — Progress Notes (Signed)
Patient ID: Isaac Jensen, male   DOB: [birth year], 48 y.o.   MRN: [id number] Skin assessment. surgical sacr's: rt shoulder,low back , neck,rt hip, left knee, and he has a Visual merchandiserpace maker rt. Upper chest. Tattoo,s 2 on rt arm , upper back and rt upper chest. 2  Old scars from h/o cutting,

## 2013-12-10 NOTE — ED Notes (Signed)
MD at bedside. 

## 2013-12-10 NOTE — ED Notes (Signed)
Christen Bameonnie, RN at [facility] called for report. Pelhman leaving now with pt.

## 2013-12-10 NOTE — Progress Notes (Signed)
D: Patient in the hallway on approach.  Patient states he is currently miserable because he has severe back pain and he is not getting his prescribed medication.  Patient states he has  A prescription for it and states he was getting it at the hospital [facility].  Patient denies SI/HI but states he is having auditory hallucinations.  Patient verbally contracts for safety.  Patient in angry in his tone but then states he does not mean it he is in pain.  Cori NP notified of patient complaints and concerned.  New orders received. A: Staff to monitor Q 15 mins for safety.  Encouragement and support offered.  Scheduled medications administered per orders.  Vistaril administered prn for sleep. R: Patient remains safe on the unit.  Patient attended group tonight.  Patient visible on the unit and interacting with peers.  Patient taking administered medicaitons.  Patient states after he took his medications tonight his pain was tolerable

## 2013-12-10 NOTE — ED Notes (Signed)
Pt resting in room. Breakfast tray given.
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
## 2013-12-10 NOTE — BH Assessment (Signed)
[facility] Assessment Progress Note   Pt will be seen at 08:00 by TTS.  There had been some question about neurosurgical consultation.  When this was resolved it was close to shift change.  Therefore the assessment will be done at 08:00.

## 2013-12-10 NOTE — BH Assessment (Signed)
Discussed tele assessment clinicals with Dr. Deretha EmoryZackowski prior to seeing this patient. Tele assessment was scheduled at 0900 and completed approx. [id number]. Patient has inpatient criteria. Writer will run patient by Renata Capriceonrad for a potential inpatient admission approx. 1015.
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
## 2013-12-10 NOTE — ED Notes (Signed)
Patient states that his mother died several years ago, that he has chronic pain and is supposed to have another surgery. States that he does not known when the surgery is. Patient states that his wife has breast cancer and that he stays stressed all the time. States that he would rather die than live this way.

## 2013-12-11 DIAGNOSIS — F333 Major depressive disorder, recurrent, severe with psychotic symptoms: Principal | ICD-10-CM

## 2013-12-11 MED ORDER — PREGABALIN 100 MG PO CAPS
100.0000 mg | ORAL_CAPSULE | Freq: Two times a day (BID) | ORAL | Status: DC
  Administered 2013-12-11 – 2013-12-14 (×7): 100 mg via ORAL
  Filled 2013-12-11 (×7): qty 1

## 2013-12-11 MED ORDER — PREGABALIN 75 MG PO CAPS: 75.0000 mg | ORAL_CAPSULE | Freq: Two times a day (BID) | ORAL | Status: DC

## 2013-12-11 MED ORDER — BOOST / RESOURCE BREEZE PO LIQD
1.0000 | Freq: Three times a day (TID) | ORAL | Status: DC
  Administered 2013-12-11 – 2013-12-14 (×9): 1 via ORAL
  Filled 2013-12-11 (×15): qty 1

## 2013-12-11 MED ORDER — FLUOXETINE HCL 20 MG PO CAPS
20.0000 mg | ORAL_CAPSULE | Freq: Every day | ORAL | Status: DC
  Administered 2013-12-11 – 2013-12-14 (×4): 20 mg via ORAL
  Filled 2013-12-11: qty 3
  Filled 2013-12-11 (×6): qty 1

## 2013-12-11 MED ORDER — QUETIAPINE FUMARATE 50 MG PO TABS
50.0000 mg | ORAL_TABLET | Freq: Every day | ORAL | Status: DC
  Administered 2013-12-11 – 2013-12-13 (×3): 50 mg via ORAL
  Filled 2013-12-11: qty 1
  Filled 2013-12-11: qty 3
  Filled 2013-12-11 (×3): qty 1

## 2013-12-11 MED ORDER — PROSIGHT PO TABS
1.0000 | ORAL_TABLET | Freq: Every day | ORAL | Status: DC
  Administered 2013-12-11 – 2013-12-14 (×4): 1 via ORAL
  Filled 2013-12-11 (×6): qty 1

## 2013-12-11 MED ORDER — OCUVITE-LUTEIN PO CAPS: 1.0000 | ORAL_CAPSULE | Freq: Every day | ORAL | Status: DC

## 2013-12-11 MED ORDER — HYDROCODONE-ACETAMINOPHEN 10-325 MG PO TABS
1.0000 | ORAL_TABLET | Freq: Four times a day (QID) | ORAL | Status: DC | PRN
  Administered 2013-12-11 – 2013-12-14 (×11): 1 via ORAL
  Filled 2013-12-11 (×11): qty 1
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
## 2013-12-11 NOTE — [facility] Group Notes (Signed)
[facility] LCSW Group Therapy  12/11/2013 1:07 PM  Type of Therapy:  Group Therapy   Participation Level:  Active  Participation Quality:  Attentive  Affect:  Flat  Cognitive:  Appropriate  Insight:  Engaged  Engagement in Therapy:  Limited  Modes of Intervention:  Discussion and Socialization   Summary of Progress/Problems:  Chaplain was here to lead a group on themes of hope and courage.  Isaac Jensen defined community as the people that are in your neighborhood.  At first, Isaac Jensen stated that he doesn't have a community, but with probing, he identified that a neighbor is a part of his community.  The neighbor has been very supportive in his wife's breast cancer.  He discussed giving support to his wife about her decision regarding the double mastectomy.  He doesn't agree with it, but has to support his wife.  Isaac Jensen left in the last 15 minutes of group.     Isaac Jensen   12/11/2013  1:07 PM
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
## 2013-12-11 NOTE — [facility] Suicide Risk Assessment (Signed)
[facility] INPATIENT:  Family/Significant Other Suicide Prevention Education  Suicide Prevention Education:  Education Completed; Wife, Debbora DusChristine Vidales, [phone] has been identified by the patient as the family member/significant other with whom the patient will be residing, and identified as the person(s) who will aid the patient in the event of a mental health crisis (suicidal ideations/suicide attempt).  With written consent from the patient, the family member/significant other has been provided the following suicide prevention education, prior to the and/or following the discharge of the patient.  The suicide prevention education provided includes the following:  Suicide risk factors  Suicide prevention and interventions  National Suicide Hotline telephone number  [facility] telephone number  [facility] [place] Emergency Assistance 911  [facility] and/or [facility]idential [facility] Crisis [facility] telephone number  Request made of family/significant other to:  Remove weapons (e.g., guns, rifles, knives), all items previously/currently identified as safety concern.    Remove drugs/medications (over-the-counter, prescriptions, illicit drugs), all items previously/currently identified as a safety concern.  The family member/significant other verbalizes understanding of the suicide prevention education information provided.  The family member/significant other agrees to remove the items of safety concern listed above.  Simona HuhYang, Alessandria Henken 12/11/2013, 11:34 AM

## 2013-12-11 NOTE — [facility] Group Notes (Signed)
[facility]   12/11/2013 9:49 AM  Participation Quality:  Engaged  Mood/Affect:  Depressed  Depression Rating:  5  Anxiety Rating:  10  Thoughts of Suicide:  No Will you contract for safety?   NA  Current AVH:  No  Plan for Discharge/Comments:  Isaac CoveRoger began by talking about all his medical problems.  Only after I asked him specifically about mental health did he say he tried to hang himself before coming in.  Attributes his depression to his chronic pain and his wife's cancer.  Stated he 's doing a lot better "since I got here and know I am going to get help."  Transportation Means: family  Supports: family  Kiribatiorth, Baldo DaubRodney B

## 2013-12-11 NOTE — Progress Notes (Signed)
Adult Psychoeducational Group Note  Date:  12/11/2013 Time:  10:21 AM  Group Topic/Focus:  Early Warning Signs:   The focus of this group is to help patients identify signs or symptoms they exhibit before slipping into an unhealthy state or crisis.  Participation Level:  Did Not Attend  Additional Comments:  Pt did not attend group due to being in a consult.   Cathlean CowerClouse, Anarosa Kubisiak Y 12/11/2013, 10:21 AM

## 2013-12-11 NOTE — Progress Notes (Signed)
Patient ID: Isaac Jensen, male   DOB: Jul 21, 1966, 48 y.o.   MRN: [id number] Pt resting in room with eyes open.  Needs assessed.  Pt denied.  Pt reported his day was "so so".  When asked why it was only "so so", pt reported because of his back.  Pt reported he was feeling depressed.  Pt denies SI, HI and AVH.  Pt reported he was positive for auditory hallucinations yesterday but has not experience that today.  Support and encouragement provided.  Pt receptive.  Fifteen minute checks in progress for patient safety.  Pt safe on unit.

## 2013-12-11 NOTE — Progress Notes (Signed)
NUTRITION ASSESSMENT  Pt identified as at risk on the Malnutrition Screen Tool  INTERVENTION: 1. Educated patient on the importance of nutrition and encouraged intake of food and beverages. 2. Discussed weight goals. 3. Supplements: Resource Breeze tid and MVI daily  NUTRITION DIAGNOSIS: Predicted sub optimal intake related to chronic pain AEB by patient report.  Goal: Pt to meet >/= 90% of their estimated nutrition needs.  Monitor:  PO intake  Assessment:  Patient admitted s/p SA which patient states that he did secondary to continued severe pain.  Patient is currently angry because he is not getting his pain medication.  Patient states appetite is variable secondary to pain level.  Has eaten some here. Reports that at home he will go 4-5 days without eating secondary to pain.  Dislikes Ensure.    Patient meets criteria for moderate malnutrition related to chronic illness AEB intake <75% for greater than 1 month and obvious muscle loss by appearance.    48 y.o. male  Height: 5'8.5"  Weight: Wt Readings from Last 1 Encounters:  12/10/13 163 lb (73.936 kg)    Weight Hx: Wt Readings from Last 10 Encounters:  12/10/13 163 lb (73.936 kg)  12/10/13 170 lb (77.111 kg)  10/16/13 169 lb (76.658 kg)  09/28/13 170 lb (77.111 kg)  09/05/13 170 lb (77.111 kg)  07/30/13 160 lb (72.576 kg)  07/06/13 175 lb (79.379 kg)  02/20/13 170 lb (77.111 kg)  02/07/13 170 lb (77.111 kg)  12/12/12 170 lb (77.111 kg)    BMI:  24.5 Pt meets criteria for normal weight for height based on current BMI.  Estimated Nutritional Needs: Kcal: 25-30 kcal/kg Protein: > 1 gram protein/kg Fluid: 1 ml/kcal  Diet Order: General Pt is also offered choice of unit snacks mid-morning and mid-afternoon.  Pt is eating as desired.   Lab results and medications reviewed.   Oran ReinLaura Lewin Pellow, RD, LDN Clinical Inpatient Dietitian Pager:  [phone] Weekend and after hours pager:  [phone]

## 2013-12-12 DIAGNOSIS — F331 Major depressive disorder, recurrent, moderate: Secondary | ICD-10-CM

## 2013-12-12 LAB — BASIC METABOLIC PANEL
BUN: 10 mg/dL (ref 6–23)
CALCIUM: 9.7 mg/dL (ref 8.4–10.5)
CO2: 21 meq/L (ref 19–32)
Chloride: 105 mEq/L (ref 96–112)
Creatinine, Ser: 0.66 mg/dL (ref 0.50–1.35)
GFR calc Af Amer: >90 mL/min (ref >90)
GFR calc non Af Amer: >90 mL/min (ref >90)
Glucose, Bld: 131 mg/dL — ABNORMAL HIGH (ref 70–99)
Potassium: 4.2 mEq/L (ref 3.7–5.3)
SODIUM: 140 meq/L (ref 137–147)

## 2013-12-12 NOTE — Progress Notes (Signed)
``  [name] remains [name] , tolerated fair. He is pleasant, states he is awaiting back surgery " so I won't hurt so bad" and was  Medicated with 1 hydrocodone at 0900 this morning, returned to bed and has not attended any groups since then. A He is encouraged to complete his AM self inventory but has yet to do so thus far. R Safety is in place and poc maintained.
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
## 2013-12-12 NOTE — Progress Notes (Signed)
[facility] MD Progress Note  12/12/2013 10:47 AM Isaac Jensen  MRN:  [id number] Subjective:  Admitted for depression and suicidal toughts. Wife sick with cancer also has used marijuana. Still endorses depressed mood and withdrawan affect. No suicidal plan. Slept better. Medication reviewed and no reported side effects. Tolerating it reasonable. 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  Neck: Neck supple.  Psychiatric: His affect is blunt. He is slowed. Cognition and memory are normal. He exhibits a depressed mood. He expresses suicidal ideation.    Review of Systems  Constitutional: Negative.   Eyes: Negative.   Endo/Heme/Allergies: Negative.   Psychiatric/Behavioral: Positive for depression, suicidal ideas and hallucinations. The patient is nervous/anxious.     Blood pressure 111/76, pulse 75, temperature 97.9 F (36.6 C), temperature source Oral, resp. rate 18, height 6' 8.5" (2.045 m), weight 73.936 kg (163 lb).Body mass index is 17.68 kg/(m^2).  General Appearance: Casual  Eye Contact::  Fair  Speech:  Slow  Volume:  Decreased  Mood:  Dysphoric  Affect:  Constricted  Thought Process:  Coherent  Orientation:  Full (Time, Place, and Person)  Thought Content:  Rumination  Suicidal Thoughts:  Yes.  without intent/plan   Homicidal Thoughts:  No  Memory:  Recent;   Fair  Judgement:  Impaired  Insight:  Lacking  Psychomotor Activity:  Decreased  Concentration:  Fair  Recall:  [place] Fair  Akathisia:  Negative  Handed:  Right  AIMS (if indicated):     Assets:  Desire for Improvement Financial Resources/Insurance Social Support  Sleep:  Number of Hours: 6   Musculoskeletal: Strength & Muscle Tone: within normal limits Gait & Station: normal Patient leans: N/A  Current Medications: Current Facility-Administered Medications  Medication Dose Route Frequency Provider Last Rate Last Dose  . acetaminophen (TYLENOL) tablet 650 mg  650 mg Oral Q6H PRN LElmarie Shiley NP      . alum & mag hydroxide-simeth (MAALOX/MYLANTA) 200-200-20 MG/5ML suspension 30 mL  30 mL Oral Q4H PRN LElmarie Shiley NP      . feeding supplement (RESOURCE BREEZE) (RESOURCE BREEZE) liquid 1 Container  1 Container Oral TID BM LDarrol Jump RD   1 Container at 12/12/13 [phone] . FLUoxetine (PROZAC) capsule 20 mg  20 mg Oral Daily Mojeed Akintayo   20 mg at 12/12/13 0801  . HYDROcodone-acetaminophen (NORCO) 10-325 MG per tablet 1 tablet  1 tablet Oral Q6H PRN LElmarie Shiley NP   1 tablet at 12/12/13 0913  . hydrOXYzine (ATARAX/VISTARIL) tablet 50 mg  50 mg Oral QHS PRN LElmarie Shiley NP   50 mg at 12/11/13 2138  . magnesium hydroxide (MILK OF MAGNESIA) suspension 30 mL  30 mL Oral Daily PRN LElmarie Shiley NP      . multivitamin (PROSIGHT) tablet 1 tablet  1 tablet Oral Daily Mojeed Akintayo   1 tablet at 12/12/13 0801  . OLANZapine  zydis (ZYPREXA) disintegrating tablet 10 mg  10 mg Oral Q8H PRN Elmarie Shiley, NP   10 mg at 12/11/13 0753  . pregabalin (LYRICA) capsule 100 mg  100 mg Oral BID Mojeed Akintayo   100 mg at 12/12/13 0801  . QUEtiapine (SEROQUEL) tablet 50 mg  50 mg Oral QHS Mojeed Akintayo   50 mg at 12/11/13 2138    Lab Results:  Results for orders placed during the hospital encounter of 12/10/13 (from  the past 48 hour(s))  BASIC METABOLIC PANEL     Status: Abnormal   Collection Time    12/12/13  6:35 AM      Result Value Range   Sodium 140  137 - 147 mEq/L   Potassium 4.2  3.7 - 5.3 mEq/L   Chloride 105  96 - 112 mEq/L   CO2 21  19 - 32 mEq/L   Glucose, Bld 131 (*) 70 - 99 mg/dL   BUN 10  6 - 23 mg/dL   Creatinine, Ser 0.66  0.50 - 1.35 mg/dL   Calcium 9.7  8.4 - 10.5 mg/dL   GFR calc non Af Amer >90  >90 mL/min   GFR calc Af Amer >90  >90 mL/min   Comment: (NOTE)     The eGFR has been calculated using the CKD EPI equation.     This calculation has not been validated in all clinical situations.     eGFR's persistently <90 mL/min signify possible Chronic Kidney     Disease.     Performed at [facility]    Physical Findings: AIMS: Facial and Oral Movements Muscles of Facial Expression: None, normal Lips and Perioral Area: None, normal Jaw: None, normal Tongue: None, normal,Extremity Movements Upper (arms, wrists, hands, fingers): None, normal Lower (legs, knees, ankles, toes): None, normal, Trunk Movements Neck, shoulders, hips: None, normal, Overall Severity Severity of abnormal movements (highest score from questions above): None, normal Incapacitation due to abnormal movements: None, normal Patient's awareness of abnormal movements (rate only patient's report): No Awareness, Dental Status Current problems with teeth and/or dentures?: No Does patient usually wear dentures?: Yes  CIWA:    COWS:     Treatment Plan Summary: Daily contact with patient to assess and evaluate symptoms and progress in treatment Medication management Continue current medications. Still  depressed but no suicidal plan. Continue olanzapine and prozac. Support groups regarding marijuana use and depression.  Plan:  Medical Decision Making Problem Points:  Established problem, stable/improving (1), Review of last therapy session (1) and Review of psycho-social stressors  (1) Data Points:  Review or order clinical lab tests (1) Review or order medicine tests (1) Review of medication regiment & side effects (2)  I certify that inpatient services furnished can reasonably be expected to improve the patient's condition.   ,  12/12/2013, 10:47 AM

## 2013-12-12 NOTE — Progress Notes (Signed)
The focus of this group is to help patients review their daily goal of treatment and discuss progress on daily workbooks. Pt attended the evening group session and responded to all discussion prompts from the Writer. Pt shared that today was a good day on the unit, the highlights of which were "having good groups and being able to go to the gym." Pt reported having no additional needs from Nursing Staff this evening. Pt's affect was appropriate.

## 2013-12-12 NOTE — [facility] Group Notes (Signed)
[facility] Group Notes:  (Nursing/MHT/Case Management/Adjunct)  Date:  12/12/2013  Time:  0930  Type of Therapy:  Nurse Education  Participation Level:  Active  Participation Quality:  Appropriate and Attentive  Affect:  Appropriate  Cognitive:  Alert and Appropriate  Insight:  Good  Engagement in Group:  Engaged  Modes of Intervention:  Education and Support  Summary of Progress/Problems:  Isaac Jensen, Isaac Jensen 12/12/2013, 10:35 AM

## 2013-12-12 NOTE — Progress Notes (Signed)
[facility] Group Notes:  (Nursing/MHT/Case Management/Adjunct)  Date:  12/12/2013  Time:  8:00 p.m.   Type of Therapy:  Psychoeducational Skills  Participation Level:  Active  Participation Quality:  Attentive  Affect:  Appropriate  Cognitive:  Appropriate  Insight:  Appropriate  Engagement in Group:  Developing/Improving  Modes of Intervention:  Education  Summary of Progress/Problems: The patient stated that he had a good day overall. He discussed how he needs to take his medication as scheduled. He also mentioned that he went downstairs to the gym to play volleyball. As a theme for the day, he indicated that his relapse prevention will include remaining on his remaining on his medication.   Jahmez Bily S 12/12/2013, 1:53 AM

## 2013-12-13 NOTE — Progress Notes (Signed)
D- Fredrik CoveRoger is out on milieu interacting with peers and attending groups.  He denies SI and continues to request prn pain medications for chronic back pain.  Did not turn in a patient inventory sheet.  A- Support and encouragement offered.  Continue current POC and evaluation of treatment goals.  Continue 15'checks for safety.. R- Safety maintained.

## 2013-12-13 NOTE — Progress Notes (Signed)
Patient ID: Isaac Jensen, male   DOB: 1966/08/25, 48 y.o.   MRN: [id number] [facility] MD Progress Note  12/13/2013 10:47 AM WYLDER MACOMBER  MRN:  [id number] Subjective:  Admitted for depression and suicidal toughts. Wife sick with cancer also has used marijuana. Still endorses depressed mood and withdrawan affect but improved since yesterday. At the end he says he is feeling better. No suicidal plan. Slept better. Medication reviewed and no reported side effects. Tolerating it reasonable. 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  Neck: Neck supple.  Psychiatric: His affect is blunt. He is slowed. Cognition and memory are normal. He exhibits a depressed mood. He expresses suicidal ideation.    Review of Systems  Constitutional: Negative.   Eyes: Negative.   Endo/Heme/Allergies: Negative.   Psychiatric/Behavioral: Positive for depression, suicidal ideas and hallucinations. The patient is nervous/anxious.     Blood pressure 111/76, pulse 75, temperature 97.9 F (36.6 C), temperature source Oral, resp. rate 18, height 6' 8.5" (2.045 m), weight 73.936 kg (163 lb).Body mass index is 17.68 kg/(m^2).  General Appearance: Casual  Eye Contact::  Fair  Speech:  Slow  Volume:  Decreased  Mood:  Dysphoric  Affect:  Constricted   Thought Process:  Coherent  Orientation:  Full (Time, Place, and Person)  Thought Content:  Rumination  Suicidal Thoughts:  Yes.  without intent/plan  Homicidal Thoughts:  No  Memory:  Recent;   Fair  Judgement:  Impaired  Insight:  Lacking  Psychomotor Activity:  Decreased  Concentration:  Fair  Recall:  [place]  Akathisia:  Negative  Handed:  Right  AIMS (if indicated):     Assets:  Desire for Improvement Financial Resources/Insurance Social Support  Sleep:  Number of Hours: 6   Musculoskeletal: Strength & Muscle Tone: within normal limits Gait & Station: normal Patient leans: N/A  Current Medications: Current Facility-Administered Medications  Medication Dose Route Frequency Provider Last Rate Last Dose  . acetaminophen (TYLENOL) tablet 650 mg  650 mg Oral Q6H PRN LElmarie Shiley NP      . alum & mag hydroxide-simeth (MAALOX/MYLANTA) 200-200-20 MG/5ML suspension 30 mL  30 mL Oral Q4H PRN LElmarie Shiley NP      . feeding supplement (RESOURCE BREEZE) (RESOURCE BREEZE) liquid 1 Container  1 Container Oral TID BM LDarrol Jump RD   1 Container at 12/13/13 1045  . FLUoxetine (PROZAC) capsule 20 mg  20 mg Oral Daily Mojeed Akintayo   20 mg at 12/13/13 0806  . HYDROcodone-acetaminophen (NORCO) 10-325 MG per tablet 1 tablet  1 tablet Oral Q6H PRN LElmarie Shiley NP   1 tablet at 12/13/13 0806  . hydrOXYzine (ATARAX/VISTARIL) tablet 50 mg  50 mg Oral QHS PRN LElmarie Shiley NP   50 mg at 12/12/13 2141  . magnesium hydroxide (MILK OF MAGNESIA) suspension 30 mL  30 mL Oral Daily PRN LElmarie Shiley  NP      . multivitamin (PROSIGHT) tablet 1 tablet  1 tablet Oral Daily Mojeed Akintayo   1 tablet at 12/13/13 0806  . OLANZapine zydis (ZYPREXA) disintegrating tablet 10 mg  10 mg Oral Q8H PRN Elmarie Shiley, NP   10 mg at 12/11/13 0753  . pregabalin (LYRICA) capsule 100 mg  100 mg Oral BID Mojeed Akintayo   100 mg at 12/12/13 1840  . QUEtiapine (SEROQUEL) tablet 50  mg  50 mg Oral QHS Mojeed Akintayo   50 mg at 12/12/13 2141    Lab Results:  Results for orders placed during the hospital encounter of 12/10/13 (from the past 48 hour(s))  BASIC METABOLIC PANEL     Status: Abnormal   Collection Time    12/12/13  6:35 AM      Result Value Range   Sodium 140  137 - 147 mEq/L   Potassium 4.2  3.7 - 5.3 mEq/L   Chloride 105  96 - 112 mEq/L   CO2 21  19 - 32 mEq/L   Glucose, Bld 131 (*) 70 - 99 mg/dL   BUN 10  6 - 23 mg/dL   Creatinine, Ser 0.66  0.50 - 1.35 mg/dL   Calcium 9.7  8.4 - 10.5 mg/dL   GFR calc non Af Amer >90  >90 mL/min   GFR calc Af Amer >90  >90 mL/min   Comment: (NOTE)     The eGFR has been calculated using the CKD EPI equation.     This calculation has not been validated in all clinical situations.     eGFR's persistently <90 mL/min signify possible Chronic Kidney     Disease.     Performed at [facility]    Physical Findings: AIMS: Facial and Oral Movements Muscles of Facial Expression: None, normal Lips and Perioral Area: None, normal Jaw: None, normal Tongue: None, normal,Extremity Movements Upper (arms, wrists, hands, fingers): None, normal Lower (legs, knees, ankles, toes): None, normal, Trunk Movements Neck, shoulders, hips: None, normal, Overall Severity Severity of abnormal movements (highest score from questions above): None, normal Incapacitation due to abnormal movements: None, normal Patient's awareness of abnormal movements (rate only patient's report): No Awareness, Dental Status Current problems with teeth and/or dentures?: No Does patient usually wear dentures?: Yes  CIWA:    COWS:     Treatment Plan Summary: Daily contact with patient to assess and evaluate symptoms and progress in treatment Medication management Continue current medications. Still  depressed but no suicidal plan. Continue olanzapine and prozac. Support groups regarding marijuana use and  depression.   Plan:  Medical Decision Making Problem Points:  Established problem, stable/improving (1), Review of last therapy session (1) and Review of psycho-social stressors (1) Data Points:  Review or order clinical lab tests (1) Review or order medicine tests (1) Review of medication regiment & side effects (2)  I certify that inpatient services furnished can reasonably be expected to improve the patient's condition.   Janyia Guion 12/13/2013, 10:47 AM

## 2013-12-13 NOTE — [facility] Group Notes (Signed)
[facility] Group Notes:  (Nursing/MHT/Case Management/Adjunct)  Date:  12/13/2013  Time:  1000  Type of Therapy:  spirituality  Participation Level:  Minimal  Participation Quality:  Appropriate  Affect:  Appropriate  Cognitive:  Appropriate  Insight:  Appropriate  Engagement in Group:  Improving  Modes of Intervention:  Socialization  Summary of Progress/Problems:  Cresenciano LickCrissman, Shaul Trautman G 12/13/2013, 11:28 AM

## 2013-12-13 NOTE — [facility] Group Notes (Signed)
[facility] Group Notes:  (Clinical Social Work)  12/13/2013   11:15am-12:00pm  Summary of Progress/Problems:  The main focus of today's process group was to listen to a variety of genres of music and to identify that different types of music provoke different responses.  The patient then was able to identify personally what was soothing for them, as well as energizing.  Handouts were used to record feelings evoked, as well as how patient can personally use this knowledge in sleep habits, with depression, and with other symptoms.  The patient expressed understanding of concepts, as well as knowledge of how each type of music affected him and how this can be used at home as a wellness/recovery tool.  Type of Therapy:  Music Therapy   Participation Level:  Active  Participation Quality:  Attentive and Sharing  Affect:  Appropriate  Cognitive:  Oriented  Insight:  Engaged  Engagement in Therapy:  Engaged  Modes of Intervention:   Activity, Exploration  Ambrose MantleMareida Grossman-Orr, LCSW 12/13/2013, 12:30pm

## 2013-12-13 NOTE — Progress Notes (Signed)
Pt's status relatively unchanged. He continues to report lessening depression and denies any SI/HI/AVH. Reports improved pain relief with the addition of hydrocodone prn. Per his report, awaiting additional back surgery. Pt supported, encouraged. Medicated per orders. Hydrocodone given at hs for a pain rating of 7/10 and vistaril given for sleep. Pt currently in dayroom with peers. Appropriate and cooperative. Lawrence MarseillesFriedman, Penni Penado Eakes

## 2013-12-13 NOTE — Progress Notes (Signed)
Pt has been up and visible in the milieu this evening. He is pleasant and cooperative and states overall his day has been good. He is flat and depressed but denies SI this evening. Also denies any hallucinations. Pt given support and encouragement. Medicated per orders and given hydrocodone for his chronic back pain as well as vistaril for sleep. On reassess pain decreased from a "7" to a "4" and pt was beginning to get sleepy. Pt remains safe currently resting in bed. Lawrence MarseillesFriedman, Jonisha Kindig Eakes

## 2013-12-14 MED ORDER — PREGABALIN 300 MG PO CAPS: 300.0000 mg | ORAL_CAPSULE | Freq: Two times a day (BID) | ORAL | Status: AC

## 2013-12-14 MED ORDER — QUETIAPINE FUMARATE 50 MG PO TABS: 50.0000 mg | ORAL_TABLET | Freq: Every day | ORAL | Status: AC

## 2013-12-14 MED ORDER — PROSIGHT PO TABS: 1.0000 | ORAL_TABLET | Freq: Every day | ORAL | Status: AC

## 2013-12-14 MED ORDER — FLUOXETINE HCL 20 MG PO CAPS: 20.0000 mg | ORAL_CAPSULE | Freq: Every day | ORAL | Status: AC
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
## 2013-12-14 NOTE — [facility] Suicide Risk Assessment (Signed)
   Demographic Factors:  Male, Caucasian, Low socioeconomic status and living with his family  Total Time spent with patient: 30 minutes  Psychiatric Specialty Exam: Physical Exam  Psychiatric: He has a normal mood and affect. His speech is normal and behavior is normal. Judgment and thought content normal. Cognition and memory are normal.    Review of Systems  Constitutional: Negative.   HENT: Negative.   Eyes: Negative.   Respiratory: Negative.   Cardiovascular: Negative.   Gastrointestinal: Negative.   Genitourinary: Negative.   Musculoskeletal: Positive for back pain and myalgias.  Skin: Negative.   Neurological: Negative.   Endo/Heme/Allergies: Negative.   Psychiatric/Behavioral: Negative.     Blood pressure 100/71, pulse 96, temperature 97.2 F (36.2 C), temperature source Oral, resp. rate 20, height 6' 8.5" (2.045 m), weight 73.936 kg (163 lb).Body mass index is 17.68 kg/(m^2).  General Appearance: Fairly Groomed  Patent attorneyye Contact::  Good  Speech:  Clear and Coherent  Volume:  Normal  Mood:  Euthymic  Affect:  Appropriate  Thought Process:  Goal Directed  Orientation:  Full (Time, Place, and Person)  Thought Content:  Negative  Suicidal Thoughts:  No  Homicidal Thoughts:  No  Memory:  Immediate;   Good Recent;   Good Remote;   Good  Judgement:  Intact  Insight:  Fair  Psychomotor Activity:  Normal  Concentration:  Fair  Recall:  Good  Fund of Knowledge:Good  Language: Good  Akathisia:  No  Handed:  Right  AIMS (if indicated):     Assets:  Communication Skills Desire for Improvement Physical Health Social Support  Sleep:  Number of Hours: 6    Musculoskeletal: Strength & Muscle Tone: within normal limits Gait & Station: normal Patient leans: N/A   Mental Status Per Nursing Assessment::   On Admission:  Self-harm thoughts  Current Mental Status by Physician: patient denies suicidal ideation, intent or plan  Loss Factors: Financial problems/change  in socioeconomic status  Historical Factors: Impulsivity  Risk Reduction Factors:   Sense of responsibility to family, Living with another person, especially a relative and Positive social support  Continued Clinical Symptoms:  Resolving psychotic and depressive symptoms  Cognitive Features That Contribute To Risk:  Polarized thinking    Suicide Risk:  Minimal: No identifiable suicidal ideation.  Patients presenting with no risk factors but with morbid ruminations; may be classified as minimal risk based on the severity of the depressive symptoms  Discharge Diagnoses:   AXIS I:  Depression, major, recurrent, severe with psychosis  AXIS II:  Deferred AXIS III:   Past Medical History  Diagnosis Date  . History of seizures   . Chronic pain   . Status post placement of cardiac pacemaker     [facility] - Medtronic Revo  . Vasovagal syncope   . History of cardiac catheterization     Normal coronaries 2009   AXIS IV:  other psychosocial or environmental problems and problems related to social environment AXIS V:  61-70 mild symptoms  Plan Of Care/Follow-up recommendations:  Activity:  as tolerated Diet:  healthy Tests:  routine blood work up Other:  patient to his after care appointment  Is patient on multiple antipsychotic therapies at discharge:  No   Has Patient had three or more failed trials of antipsychotic monotherapy by history:  No  Recommended Plan for Multiple Antipsychotic Therapies: NA    Thedore MinsAkintayo, Shirlyn Savin, MD 12/14/2013, 10:02 AM

## 2013-12-14 NOTE — Progress Notes (Signed)
Patient ID: Isaac BameRoger D Kitner, male   DOB: 10/09/1966, 48 y.o.   MRN: [id number] D: patient reports less depression today.  He rates his depression and hopelessness a 1/10.  Patient states, "I went off my meds and I think it made me suicidal."  He denies any SI/HI/AVH.  He has bright affect.  Patient is being discharged this afternoon.  A: continue safety checks every 15 minutes per protocol.  Support patient as needed.  R: patient is receptive to staff and his behavior is appropriate.

## 2013-12-14 NOTE — Progress Notes (Signed)
[facility]  Will you be returning to the same living situation after discharge: Yes,  home At discharge, do you have transportation home?:Yes,  family Do you have the ability to pay for your medications:Yes,  MCD  Release of information consent forms completed and in the chart;  Patient's signature needed at discharge.  Patient to Follow up at: Follow-up Information   Follow up with [facility] On 12/16/2013. (Go to the walk-in clinic between 8 and 10AM on Wedenesday for your hospital follow up appointment)    Contact information:   [address] [place]  [phone]      Patient denies SI/HI:   Yes,  yes    Safety Planning and Suicide Prevention discussed:  Yes,  yes  Daryel Geraldorth, Solomiya Pascale B 12/14/2013, 11:14 AM

## 2013-12-14 NOTE — Progress Notes (Signed)
Patient ID: Isaac BameRoger D Zoll, male   DOB: [birth year], 48 y.o.   MRN: [id number] Patient discharged per MD order.  Patient received all personal belongings, prescriptions and medication samples.  He denies any SI/HI/AVH.  Patient was appreciative of staff's assistance.  He left ambulatory with his son.

## 2013-12-14 NOTE — Tx Team (Signed)
  Interdisciplinary Treatment Plan Update   Date Reviewed:  12/14/2013  Time Reviewed:  11:12 AM  Progress in Treatment:   Attending groups: Yes Participating in groups: Yes Taking medication as prescribed: Yes  Tolerating medication: Yes Family/Significant other contact made: Yes  Patient understands diagnosis: Yes  Discussing patient identified problems/goals with staff: Yes Medical problems stabilized or resolved: Yes Denies suicidal/homicidal ideation: Yes Patient has not harmed self or others: Yes  For review of initial/current patient goals, please see plan of care.  Estimated Length of Stay:  D/C today  Reason for Continuation of Hospitalization:   New Problems/Goals identified:  N/A  Discharge Plan or Barriers:   return home, follow up outpt  Additional Comments:  Attendees:  Signature: Thedore MinsMojeed Akintayo, MD 12/14/2013 11:12 AM   Signature: Richelle Itood Kelii Chittum, LCSW 12/14/2013 11:12 AM  Signature: Fransisca KaufmannLaura Davis, NP 12/14/2013 11:12 AM  Signature: Joslyn Devonaroline Beaudry, RN 12/14/2013 11:12 AM  Signature: Liborio NixonPatrice White, RN 12/14/2013 11:12 AM  Signature:  12/14/2013 11:12 AM  Signature:   12/14/2013 11:12 AM  Signature:    Signature:    Signature:    Signature:    Signature:    Signature:      Scribe for Treatment Team:   Richelle Itood Cullan Launer, LCSW  12/14/2013 11:12 AM

## 2013-12-18 NOTE — Progress Notes (Signed)
Patient Discharge Instructions:  After Visit Summary (AVS):   Faxed to:  12/18/13 Discharge Summary Note:   Faxed to:  12/18/13 Psychiatric Admission Assessment Note:   Faxed to:  12/18/13 Suicide Risk Assessment - Discharge Assessment:   Faxed to:  12/18/13 Faxed/Sent to the Next Level Care provider:  12/18/13 [facility] [phone]  Jerelene ReddenSheena E Titus, 12/18/2013, 3:28 PM

## 2013-12-30 ENCOUNTER — Telehealth: Payer: Self-pay | Admitting: *Deleted

## 2013-12-30 NOTE — Telephone Encounter (Signed)
Spoke to LecomptonPaula D, she is to call Patsy LagerYolanda at [facility] with recommendations about pacemaker.

## 2013-12-30 NOTE — Telephone Encounter (Signed)
LEFT VOICE MESSAGE ON 12/29/13 AT 11:30AM THAT PATIENT IS SCHEDULED TO HAVE SURGERY ON 01/01/14 AND THEY NEED TO KNOW WHAT TO DO ABOUT HIS PACEMAKER

## 2013-12-30 NOTE — Telephone Encounter (Signed)
Called Patsy LagerYolanda at [facility] for more information. Left message to call back.

## 2014-01-06 ENCOUNTER — Telehealth: Payer: Self-pay | Admitting: *Deleted

## 2014-01-06 NOTE — Telephone Encounter (Signed)
Called and left message and made pt aware.

## 2014-01-06 NOTE — Telephone Encounter (Signed)
In general terms, taking an aspirin is not going to improve his circulation. If he did have recent major back surgery, would recommend that he speak with his surgeon about his foot symptoms to make sure that they do not think this is some type of neurological symptom. I do not see that he has any documented history of PAD.

## 2014-01-06 NOTE — Telephone Encounter (Signed)
Patient states that he doesn't feel like he is getting good circulation and qould like to know if it would be okay to start taking a  aspin

## 2014-01-06 NOTE — Telephone Encounter (Signed)
Pt states he  just had major back surgery, while in the hospital pt states he kept him on Heprin. Pt now states his feet is staying cold and doesn't think he is having good circulation. Pt wants to know if he can take an asa 81 mg daily. Please advise

## 2014-01-06 NOTE — Telephone Encounter (Signed)
Pt called back stating his feet has stayed cold before back surgery. Pt states that since they turned down his pacemaker that his feet stay cold. Please advise since I have already spoken with Dr Diona BrownerMcDowell about this problem. Pt knows that we our office closes at 3 pm today.

## 2014-01-18 ENCOUNTER — Ambulatory Visit (INDEPENDENT_AMBULATORY_CARE_PROVIDER_SITE_OTHER): Payer: Medicare Other | Admitting: Internal Medicine

## 2014-01-18 ENCOUNTER — Encounter: Payer: Self-pay | Admitting: Internal Medicine

## 2014-01-18 VITALS — BP 100/66 | HR 84 | Ht 70.0 in | Wt 176.0 lb

## 2014-01-18 DIAGNOSIS — R55 Syncope and collapse: Secondary | ICD-10-CM

## 2014-01-18 DIAGNOSIS — R001 Bradycardia, unspecified: Secondary | ICD-10-CM

## 2014-01-18 DIAGNOSIS — Z950 Presence of cardiac pacemaker: Secondary | ICD-10-CM | POA: Insufficient documentation

## 2014-01-18 DIAGNOSIS — I498 Other specified cardiac arrhythmias: Secondary | ICD-10-CM

## 2014-01-18 LAB — MDC_IDC_ENUM_SESS_TYPE_INCLINIC
Brady Statistic AP VP Percent: 0.02 %
Brady Statistic AP VS Percent: 43.87 %
Brady Statistic AS VP Percent: 0 %
Brady Statistic AS VS Percent: 56.11 %
Lead Channel Impedance Value: 504 Ohm
Lead Channel Pacing Threshold Amplitude: 0.5 V
Lead Channel Pacing Threshold Pulse Width: 0.4 ms
Lead Channel Sensing Intrinsic Amplitude: 5.1686
Lead Channel Sensing Intrinsic Amplitude: 8.0813
Lead Channel Setting Pacing Amplitude: 2.5 V
Lead Channel Setting Pacing Pulse Width: 0.4 ms
Lead Channel Setting Sensing Sensitivity: 0.9 mV
MDC IDC MSMT BATTERY VOLTAGE: 3 V
MDC IDC MSMT LEADCHNL RA IMPEDANCE VALUE: 384 Ohm
MDC IDC MSMT LEADCHNL RA PACING THRESHOLD AMPLITUDE: 0.5 V
MDC IDC MSMT LEADCHNL RV PACING THRESHOLD PULSEWIDTH: 0.4 ms
MDC IDC SESS DTM: 20150309100126
MDC IDC SET LEADCHNL RA PACING AMPLITUDE: 2 V
MDC IDC SET ZONE DETECTION INTERVAL: 350 ms
MDC IDC SET ZONE DETECTION INTERVAL: 400 ms
MDC IDC STAT BRADY RA PERCENT PACED: 43.89 %
MDC IDC STAT BRADY RV PERCENT PACED: 0.02 %

## 2014-01-18 NOTE — Assessment & Plan Note (Signed)
His PPM is working normally and there is no interference with his bone stimulator. Will recheck his PPM in several months.

## 2014-01-18 NOTE — Progress Notes (Signed)
HPI Isaac Jensen is referred today for ongoing evaluation and management of his PPM. He is a pleasant 48 yo man with a h/o symptomatic bradycardia and HTN. He is s/p PPM by Dr. Royann Shivers 3 years ago. He has recently undergone low back surgery and is pending a bone stimulator which we have checked today. He denies back pain, sob, or peripheral edema.  Allergies  Allergen Reactions  . Ketorolac Tromethamine Shortness Of Breath  . Sulfa Drugs Cross Reactors Shortness Of Breath  . Aspirin Diarrhea  . Penicillins Nausea And Vomiting  . Trazodone And Nefazodone Other (See Comments)    REACTION: ALTERS MENTAL STATUS     Current Outpatient Prescriptions  Medication Sig Dispense Refill  . albuterol (PROVENTIL HFA;VENTOLIN HFA) 108 (90 BASE) MCG/ACT inhaler Inhale 2 puffs into the lungs every 4 (four) hours as needed for wheezing.  1 Inhaler  0  . cyclobenzaprine (FLEXERIL) 10 MG tablet Take 1 tablet by mouth 3 (three) times daily.      Marland Kitchen FLUoxetine (PROZAC) 20 MG capsule Take 1 capsule (20 mg total) by mouth daily.  30 capsule  0  . multivitamin (PROSIGHT) TABS tablet Take 1 tablet by mouth daily. Please obtain from your local pharmacy if you wish to continue taking a multivitamin.  30 each  0  . Oxycodone HCl 10 MG TABS Take 1 tablet by mouth every 4 (four) hours as needed.      . pregabalin (LYRICA) 300 MG capsule Take 1 capsule (300 mg total) by mouth 2 (two) times daily.      . QUEtiapine (SEROQUEL) 50 MG tablet Take 1 tablet (50 mg total) by mouth at bedtime.  30 tablet  0   No current facility-administered medications for this visit.     Past Medical History  Diagnosis Date  . History of seizures   . Chronic pain   . Status post placement of cardiac pacemaker     [facility] - Medtronic Revo  . Vasovagal syncope   . Depression   . History of cardiac catheterization     Normal coronaries 2009    ROS:   All systems reviewed and negative except as noted in the HPI.   Past  Surgical History  Procedure Laterality Date  . Back surgery    . Neck surgery    . Knee arthroscopy    . Pacemaker insertion      Medtronic Revo December 2011 [facility]  . Shoulder surgery       Family History  Problem Relation Age of Onset  . Hypertension Brother   . Heart attack Brother      History   Social History  . Marital Status: Married    Spouse Name: N/A    Number of Children: N/A  . Years of Education: N/A   Occupational History  . Not on file.   Social History Main Topics  . Smoking status: Former Smoker -- 30 years    Types: Cigarettes  . Smokeless tobacco: Never Used  . Alcohol Use: No  . Drug Use: Yes    Special: Marijuana     Comment: Rarely  . Sexual Activity: Not Currently   Other Topics Concern  . Not on file   Social History Narrative  . No narrative on file     BP 100/66  Pulse 84  Ht 5\' 10"  (1.778 m)  Wt 176 lb (79.833 kg)  BMI 25.25 kg/m2  Physical Exam:  Well appearing middle  aged man, NAD HEENT: Unremarkable Neck:  No JVD, no thyromegally Back:  No CVA tenderness Lungs:  Clear with no wheezes HEART:  Regular rate rhythm, no murmurs, no rubs, no clicks Abd:  soft, positive bowel sounds, no organomegally, no rebound, no guarding Ext:  2 plus pulses, no edema, no cyanosis, no clubbing Skin:  No rashes no nodules Neuro:  CN II through XII intact, motor grossly intact   DEVICE  Normal device function.  See PaceArt for details.   Assess/Plan:

## 2014-01-18 NOTE — Assessment & Plan Note (Signed)
He has had no recurrent sycope since his PPM.

## 2014-01-18 NOTE — Patient Instructions (Signed)
Your physician recommends that you schedule a follow-up appointment in: 6 months with Gunnar FusiPaula and 12 months with Dr Court Joyaylor You will receive a reminder letter two months in advance reminding you to call and schedule your appointment. If you don't receive this letter, please contact our office.

## 2014-04-12 DEATH — deceased

## 2014-06-08 IMAGING — CT CT ANGIO CHEST
1 of 6 series · 5 of 36 positions shown · IV contrast (Omnipaque 300)
Comparison: 09/10/2012

CLINICAL DATA: Gradually increasing epigastric pain beginning this
morning. Emesis and clammy feeling.

EXAM:
CT ANGIOGRAPHY CHEST WITH CONTRAST
TECHNIQUE: Multidetector CT imaging of the chest was performed using the
standard protocol during bolus administration of intravenous
contrast. Multiplanar CT image reconstructions including MIPs were
obtained to evaluate the vascular anatomy.
CONTRAST:  100mL OMNIPAQUE IOHEXOL 350 MG/ML SOLN

[Series 4: pe 3.0 b40f · axial · 0.66mm/px · z∈[+505,+682]mm · 5 of 89 slices shown]
[im 15/89  lung]
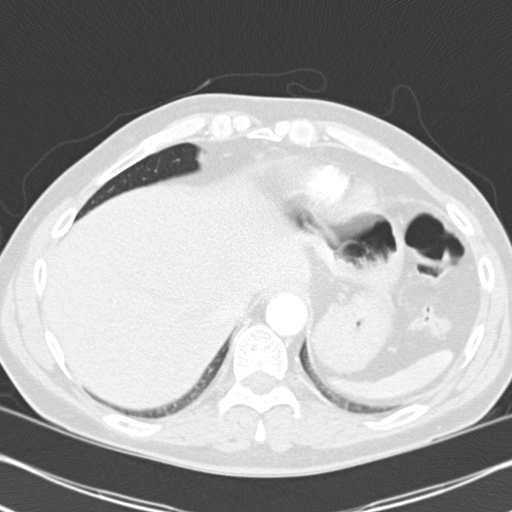
[im 30/89  mediastinal]
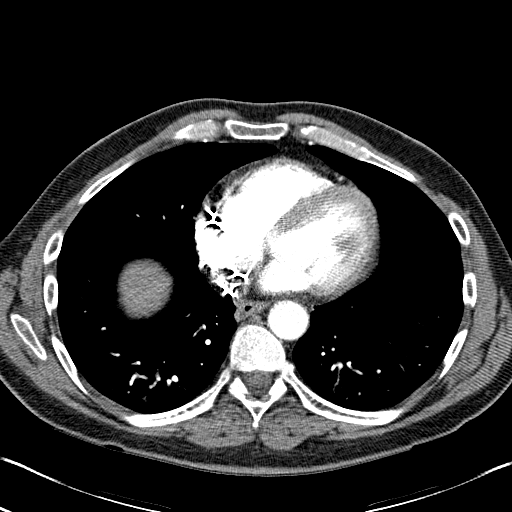
[im 45/89  lung]
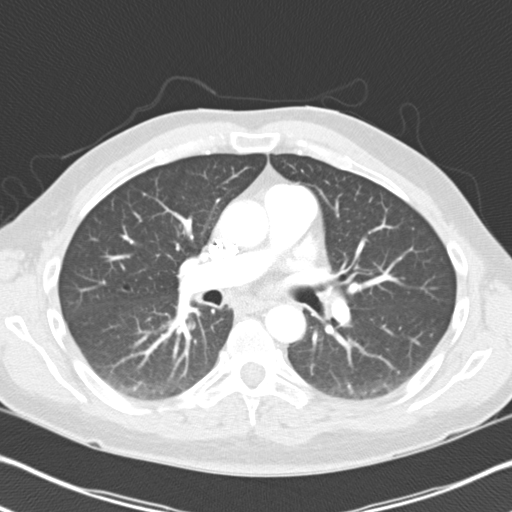
[im 59/89  mediastinal]
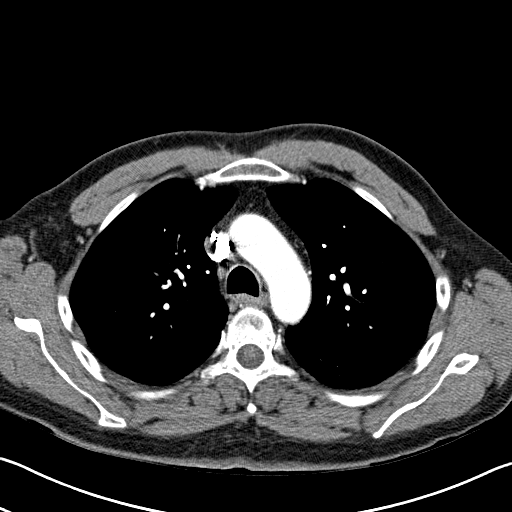
[im 74/89  lung]
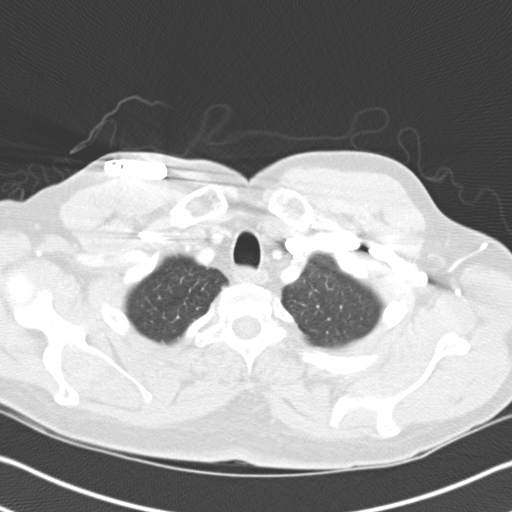

[5 of 36 positions shown; findings below may reference images not displayed]

FINDINGS: Technically adequate study with good opacification of the central
and segmental pulmonary arteries. No focal filling defects
demonstrated. No evidence of significant pulmonary embolus.

Normal heart size. Normal caliber thoracic aorta. No aortic
dissection. Esophagus is decompressed. No significant
lymphadenopathy in the chest. Cardiac pacemaker. No pleural
effusions. Visualized portions of the upper abdominal organs are
grossly unremarkable.

Mild dependent changes in the lungs. No evidence of focal
infiltration or consolidation. Mild emphysematous changes. No
pneumothorax. No pleural effusions. No significant changes since the
prior study.

Review of the MIP images confirms the above findings.
IMPRESSION: No evidence of significant pulmonary embolus.

## 2014-08-20 ENCOUNTER — Encounter: Payer: Self-pay | Admitting: *Deleted

## 2014-09-22 ENCOUNTER — Encounter: Payer: Self-pay | Admitting: *Deleted

## 2014-11-03 ENCOUNTER — Encounter: Payer: Self-pay | Admitting: *Deleted

## 2014-11-23 ENCOUNTER — Encounter: Payer: Self-pay | Admitting: *Deleted
